# Patient Record
Sex: Male | Born: 1950 | Race: White | Hispanic: No | Marital: Married | Smoking: Current some day smoker
Health system: Southern US, Community
[De-identification: ages and names within clinical notes are randomized; demographics above are authoritative.]

## PROBLEM LIST (undated history)

## (undated) DIAGNOSIS — J449 Chronic obstructive pulmonary disease, unspecified: Secondary | ICD-10-CM

## (undated) DIAGNOSIS — G8929 Other chronic pain: Secondary | ICD-10-CM

## (undated) DIAGNOSIS — I10 Essential (primary) hypertension: Secondary | ICD-10-CM

## (undated) DIAGNOSIS — IMO0002 Reserved for concepts with insufficient information to code with codable children: Secondary | ICD-10-CM

## (undated) DIAGNOSIS — B1920 Unspecified viral hepatitis C without hepatic coma: Secondary | ICD-10-CM

## (undated) DIAGNOSIS — K7460 Unspecified cirrhosis of liver: Secondary | ICD-10-CM

## (undated) DIAGNOSIS — F4310 Post-traumatic stress disorder, unspecified: Secondary | ICD-10-CM

## (undated) DIAGNOSIS — D696 Thrombocytopenia, unspecified: Secondary | ICD-10-CM

## (undated) DIAGNOSIS — M25562 Pain in left knee: Secondary | ICD-10-CM

## (undated) HISTORY — PX: JOINT REPLACEMENT: SHX530

## (undated) HISTORY — PX: KNEE SURGERY: SHX244

## (undated) HISTORY — PX: TOTAL HIP ARTHROPLASTY: SHX124

## (undated) HISTORY — PX: ANKLE SURGERY: SHX546

## (undated) HISTORY — PX: TONSILLECTOMY: SUR1361

---

## 2006-07-30 ENCOUNTER — Ambulatory Visit: Payer: Self-pay | Admitting: Orthopedic Surgery

## 2008-03-10 ENCOUNTER — Emergency Department (HOSPITAL_COMMUNITY): Admission: EM | Admit: 2008-03-10 | Discharge: 2008-03-10 | Payer: Self-pay | Admitting: Emergency Medicine

## 2009-12-18 ENCOUNTER — Emergency Department (HOSPITAL_COMMUNITY): Admission: EM | Admit: 2009-12-18 | Discharge: 2009-12-18 | Payer: Self-pay | Admitting: Emergency Medicine

## 2010-02-07 ENCOUNTER — Emergency Department (HOSPITAL_COMMUNITY): Admission: EM | Admit: 2010-02-07 | Discharge: 2010-02-07 | Payer: Self-pay | Admitting: Emergency Medicine

## 2010-10-05 ENCOUNTER — Emergency Department (HOSPITAL_COMMUNITY): Payer: Medicaid Other

## 2010-10-05 ENCOUNTER — Emergency Department (HOSPITAL_COMMUNITY)
Admission: EM | Admit: 2010-10-05 | Discharge: 2010-10-05 | Disposition: A | Discharge status: Home / Self Care | Payer: Medicaid Other | Attending: Emergency Medicine | Admitting: Emergency Medicine

## 2010-10-05 DIAGNOSIS — I10 Essential (primary) hypertension: Secondary | ICD-10-CM | POA: Insufficient documentation

## 2010-10-05 DIAGNOSIS — W19XXXA Unspecified fall, initial encounter: Secondary | ICD-10-CM | POA: Insufficient documentation

## 2010-10-05 DIAGNOSIS — IMO0002 Reserved for concepts with insufficient information to code with codable children: Secondary | ICD-10-CM | POA: Insufficient documentation

## 2010-10-05 DIAGNOSIS — Z96649 Presence of unspecified artificial hip joint: Secondary | ICD-10-CM | POA: Insufficient documentation

## 2010-11-02 ENCOUNTER — Emergency Department (HOSPITAL_COMMUNITY)
Admission: EM | Admit: 2010-11-02 | Discharge: 2010-11-02 | Discharge status: Against Medical Advice | Payer: Medicaid Other | Attending: Emergency Medicine | Admitting: Emergency Medicine

## 2010-11-02 ENCOUNTER — Encounter: Payer: Self-pay | Admitting: *Deleted

## 2010-11-02 DIAGNOSIS — R296 Repeated falls: Secondary | ICD-10-CM | POA: Insufficient documentation

## 2010-11-02 DIAGNOSIS — S99919A Unspecified injury of unspecified ankle, initial encounter: Secondary | ICD-10-CM | POA: Insufficient documentation

## 2010-11-02 DIAGNOSIS — S8990XA Unspecified injury of unspecified lower leg, initial encounter: Secondary | ICD-10-CM | POA: Insufficient documentation

## 2010-11-02 HISTORY — DX: Essential (primary) hypertension: I10

## 2010-11-02 NOTE — ED Notes (Signed)
Pain to left knee after stepping in hole and falling. Pt states no knee cap on knee due to previous injury.

## 2010-11-02 NOTE — ED Notes (Signed)
Called pt back to room and found AMA document signed by pt at 1451.  No answer when called from waiting room.

## 2010-11-03 ENCOUNTER — Emergency Department (HOSPITAL_COMMUNITY)
Admission: EM | Admit: 2010-11-03 | Discharge: 2010-11-03 | Disposition: A | Discharge status: Home / Self Care | Payer: Medicaid Other | Attending: Emergency Medicine | Admitting: Emergency Medicine

## 2010-11-03 ENCOUNTER — Encounter (HOSPITAL_COMMUNITY): Payer: Self-pay | Admitting: *Deleted

## 2010-11-03 DIAGNOSIS — S8392XA Sprain of unspecified site of left knee, initial encounter: Secondary | ICD-10-CM

## 2010-11-03 DIAGNOSIS — I10 Essential (primary) hypertension: Secondary | ICD-10-CM | POA: Insufficient documentation

## 2010-11-03 DIAGNOSIS — Y9269 Other specified industrial and construction area as the place of occurrence of the external cause: Secondary | ICD-10-CM | POA: Insufficient documentation

## 2010-11-03 DIAGNOSIS — X500XXA Overexertion from strenuous movement or load, initial encounter: Secondary | ICD-10-CM | POA: Insufficient documentation

## 2010-11-03 DIAGNOSIS — IMO0002 Reserved for concepts with insufficient information to code with codable children: Secondary | ICD-10-CM | POA: Insufficient documentation

## 2010-11-03 HISTORY — DX: Reserved for concepts with insufficient information to code with codable children: IMO0002

## 2010-11-03 MED ORDER — HYDROCODONE-ACETAMINOPHEN 5-325 MG PO TABS: ORAL_TABLET | ORAL | Status: DC

## 2010-11-03 NOTE — ED Provider Notes (Signed)
History     Chief Complaint  Patient presents with  . Knee Pain   Patient is a 60 y.o. male presenting with knee pain. The history is provided by the patient. No language interpreter was used.  Knee Pain Episode onset: several days ago.  pt had  trauma to L knee ~ 30 years ago and had patella removed.  he injured recently getting out of a hole that a footing was being poured in.  has  appt for MRI and with his orthopedist at [facility] in [place], [place] on 10-13-10. The problem occurs constantly. The problem has been gradually worsening. The symptoms are aggravated by bending. He has tried NSAIDs for the symptoms. The treatment provided mild relief.    Past Medical History  Diagnosis Date  . Hypertension   . Herniated disc     Past Surgical History  Procedure Date  . Knee surgery   . Total hip arthroplasty     No family history on file.  History  Substance Use Topics  . Smoking status: Current Some Day Smoker  . Smokeless tobacco: Not on file  . Alcohol Use: No      Review of Systems  Musculoskeletal: Positive for gait problem.  All other systems reviewed and are negative.    Physical Exam  BP 137/78  Pulse 60  Temp(Src) 97.9 F (36.6 C) (Oral)  Resp 18  Ht 5\' 7"  (1.702 m)  Wt 170 lb (77.111 kg)  BMI 26.63 kg/m2  SpO2 98%  Physical Exam  Nursing note and vitals reviewed. Constitutional: He is oriented to person, place, and time. Vital signs are normal. He appears well-developed and well-nourished.  HENT:  Head: Normocephalic and atraumatic.  Right Ear: External ear normal.  Left Ear: External ear normal.  Nose: Nose normal.  Mouth/Throat: No oropharyngeal exudate.  Eyes: Conjunctivae and EOM are normal. Pupils are equal, round, and reactive to light. Right eye exhibits no discharge. Left eye exhibits no discharge. No scleral icterus.  Neck: Normal range of motion. Neck supple. No JVD present. No tracheal deviation present. No thyromegaly present.    Cardiovascular: Normal rate, regular rhythm, normal heart sounds, intact distal pulses and normal pulses.  Exam reveals no gallop and no friction rub.   No murmur heard. Pulmonary/Chest: Effort normal and breath sounds normal. No stridor. No respiratory distress. He has no wheezes. He has no rales. He exhibits no tenderness.  Abdominal: Soft. Normal appearance and bowel sounds are normal. He exhibits no distension and no mass. There is no tenderness. There is no rebound and no guarding.  Musculoskeletal: Normal range of motion. He exhibits no edema and no tenderness.       Left knee: He exhibits swelling. He exhibits normal range of motion, no effusion, no ecchymosis, no deformity, no laceration and no erythema.       Legs: Lymphadenopathy:    He has no cervical adenopathy.  Neurological: He is alert and oriented to person, place, and time. He has normal reflexes. No cranial nerve deficit. Coordination normal. GCS eye subscore is 4. GCS verbal subscore is 5. GCS motor subscore is 6.  Reflex Scores:      Tricep reflexes are 2+ on the right side and 2+ on the left side.      Bicep reflexes are 2+ on the right side and 2+ on the left side.      Brachioradialis reflexes are 2+ on the right side and 2+ on the left side.  Patellar reflexes are 2+ on the right side and 2+ on the left side.      Achilles reflexes are 2+ on the right side and 2+ on the left side. Skin: Skin is warm and dry. No rash noted. He is not diaphoretic.  Psychiatric: He has a normal mood and affect. His speech is normal and behavior is normal. Judgment and thought content normal. Cognition and memory are normal.    ED Course  Procedures  MDM       Worthy Rancher, PA 11/03/10 1334

## 2010-11-03 NOTE — ED Notes (Signed)
Pt a/ox4. Resp even and unlabored. D/C instructions reviewed with pt. Pt verbalized understanding. Pt escorted to d/c desk. Pt ambulated with steady gate. 

## 2010-11-03 NOTE — ED Notes (Signed)
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  C/o increased pain/swelling.

## 2010-12-07 ENCOUNTER — Encounter (HOSPITAL_COMMUNITY): Payer: Self-pay | Admitting: Emergency Medicine

## 2010-12-07 ENCOUNTER — Emergency Department (HOSPITAL_COMMUNITY)
Admission: EM | Admit: 2010-12-07 | Discharge: 2010-12-07 | Disposition: A | Discharge status: Home / Self Care | Payer: Medicaid Other | Attending: Emergency Medicine | Admitting: Emergency Medicine

## 2010-12-07 DIAGNOSIS — I10 Essential (primary) hypertension: Secondary | ICD-10-CM | POA: Insufficient documentation

## 2010-12-07 DIAGNOSIS — M545 Low back pain, unspecified: Secondary | ICD-10-CM | POA: Insufficient documentation

## 2010-12-07 DIAGNOSIS — Z809 Family history of malignant neoplasm, unspecified: Secondary | ICD-10-CM | POA: Insufficient documentation

## 2010-12-07 DIAGNOSIS — F17200 Nicotine dependence, unspecified, uncomplicated: Secondary | ICD-10-CM | POA: Insufficient documentation

## 2010-12-07 HISTORY — DX: Unspecified viral hepatitis C without hepatic coma: B19.20

## 2010-12-07 MED ORDER — OXYCODONE-ACETAMINOPHEN 5-325 MG PO TABS: 1.0000 | ORAL_TABLET | ORAL | Status: AC | PRN

## 2010-12-07 NOTE — ED Provider Notes (Signed)
Medical screening examination/treatment/procedure(s) were performed by non-physician practitioner and as supervising physician I was immediately available for consultation/collaboration.   Channon Brougher B. Bernette Mayers, MD 12/07/10 1318

## 2010-12-07 NOTE — ED Provider Notes (Signed)
History     CSN: [id number] Arrival date & time: 12/07/2010 12:11 PM  Chief Complaint  Patient presents with  . Back Pain   Patient is a 60 y.o. male presenting with back pain. The history is provided by the patient.  Back Pain  This is a recurrent problem. The current episode started 3 to 5 hours ago. The problem occurs constantly. The problem has not changed since onset.The pain is associated with lifting heavy objects and twisting. The pain is present in the lumbar spine. The quality of the pain is described as stabbing, aching and shooting. The pain radiates to the right thigh. The pain is moderate. The symptoms are aggravated by bending, twisting and certain positions. The pain is the same all the time. Associated symptoms include leg pain. Pertinent negatives include no chest pain, no fever, no numbness, no headaches, no abdominal pain, no abdominal swelling, no bowel incontinence, no perianal numbness, no dysuria, no pelvic pain, no tingling and no weakness. He has tried nothing for the symptoms.    Past Medical History  Diagnosis Date  . Hypertension   . Herniated disc   . Hepatitis C   . Urinary bladder cancer     Past Surgical History  Procedure Date  . Knee surgery   . Total hip arthroplasty   . Tonsillectomy     Family History  Problem Relation Age of Onset  . Cancer Mother     History  Substance Use Topics  . Smoking status: Current Some Day Smoker -- 0.2 packs/day for 30 years    Types: Cigarettes  . Smokeless tobacco: Former Neurosurgeon    Types: Chew  . Alcohol Use: No      Review of Systems  Constitutional: Negative for fever.  Cardiovascular: Negative for chest pain.  Gastrointestinal: Negative for abdominal pain and bowel incontinence.  Genitourinary: Negative for dysuria, frequency, flank pain, decreased urine volume, difficulty urinating and pelvic pain.  Musculoskeletal: Positive for back pain. Negative for myalgias, joint swelling and gait problem.    Neurological: Negative for dizziness, tingling, weakness, numbness and headaches.  Hematological: Does not bruise/bleed easily.  All other systems reviewed and are negative.    Physical Exam  BP 141/86  Pulse 97  Temp(Src) 98.2 F (36.8 C) (Oral)  Resp 19  Ht 5\' 7"  (1.702 m)  Wt 172 lb (78.019 kg)  BMI 26.94 kg/m2  SpO2 98%  Physical Exam  Nursing note and vitals reviewed. Constitutional: He is oriented to person, place, and time. He appears well-developed and well-nourished. No distress.  HENT:  Head: Normocephalic and atraumatic.  Neck: Normal range of motion. Neck supple. No thyromegaly present.  Cardiovascular: Normal rate, regular rhythm and normal heart sounds.   Pulmonary/Chest: Effort normal and breath sounds normal.  Musculoskeletal: He exhibits tenderness. He exhibits no edema.       Lumbar back: He exhibits tenderness. He exhibits normal range of motion, no bony tenderness, no swelling, no edema and normal pulse.       Back:  Lymphadenopathy:    He has no cervical adenopathy.  Neurological: He is alert and oriented to person, place, and time. He has normal strength and normal reflexes. No cranial nerve deficit or sensory deficit. Coordination and gait normal. GCS eye subscore is 4. GCS verbal subscore is 5. GCS motor subscore is 6.  Reflex Scores:      Patellar reflexes are 2+ on the right side and 2+ on the left side.  Achilles reflexes are 2+ on the right side and 2+ on the left side. Skin: Skin is warm and dry.    ED Course  Procedures  MDM   1300  Patient is ambulatory, No focal neuro deficits, ttp of the right lumbar paraspinal muscles and right SI joint space.  Hx of previous L3-4 disc problems.  HAs appt on Wednesday with his PMD   The patient appears reasonably screened and/or stabilized for discharge and I doubt any other medical condition or other [facility] requiring further screening, evaluation, or treatment in the ED at this time prior to  discharge.           Samay Delcarlo L. [place], [place] 12/07/10 1315

## 2010-12-07 NOTE — ED Notes (Signed)
Patient c/o lower back pain. Per patient hx of 2 ruptured vertebra that he is having surgery for on 11/5. Patient states "I hurt it this morning moving tobacco barn."

## 2010-12-30 ENCOUNTER — Emergency Department (HOSPITAL_COMMUNITY)
Admission: EM | Admit: 2010-12-30 | Discharge: 2010-12-30 | Disposition: A | Discharge status: Home / Self Care | Payer: Medicaid Other | Attending: Emergency Medicine | Admitting: Emergency Medicine

## 2010-12-30 ENCOUNTER — Encounter (HOSPITAL_COMMUNITY): Payer: Self-pay

## 2010-12-30 DIAGNOSIS — M545 Low back pain, unspecified: Secondary | ICD-10-CM | POA: Insufficient documentation

## 2010-12-30 DIAGNOSIS — M5430 Sciatica, unspecified side: Secondary | ICD-10-CM

## 2010-12-30 DIAGNOSIS — Z79899 Other long term (current) drug therapy: Secondary | ICD-10-CM | POA: Insufficient documentation

## 2010-12-30 DIAGNOSIS — I10 Essential (primary) hypertension: Secondary | ICD-10-CM | POA: Insufficient documentation

## 2010-12-30 MED ORDER — OXYCODONE-ACETAMINOPHEN 5-325 MG PO TABS: 2.0000 | ORAL_TABLET | ORAL | Status: AC | PRN

## 2010-12-30 NOTE — ED Provider Notes (Signed)
Scribed for Nicholes Stairs, MD, the patient was seen in room APA17/APA17 . This chart was scribed by Ellie Lunch. This patient's care was started at 9:36 AM.   CSN: [id number] Arrival date & time: 12/30/2010  9:05 AM   Chief Complaint  Patient presents with  . Back Pain     (Include location/radiation/quality/duration/timing/severity/associated sxs/prior treatment) HPI Victor Davidson is a 60 y.o. male who presents to the Emergency Department complaining of back pain that radiated down left leg to ankle starting 3 days ago. Denies weakness, urinary or bowel incontinence. Back pain started when he was moving hay bails on Friday. Pt reports he has 2 ruptured discs and a planned surgery scheduled 01/19/11.  Past Medical History  Diagnosis Date  . Hypertension   . Herniated disc   . Hepatitis C   . Urinary bladder cancer      Past Surgical History  Procedure Date  . Knee surgery   . Total hip arthroplasty   . Tonsillectomy     Family History  Problem Relation Age of Onset  . Cancer Mother     History  Substance Use Topics  . Smoking status: Current Some Day Smoker -- 0.2 packs/day for 30 years    Types: Cigarettes  . Smokeless tobacco: Former Neurosurgeon    Types: Chew  . Alcohol Use: No    Review of Systems 10 Systems reviewed and are negative for acute change except as noted in the HPI.  Allergies  Review of patient's allergies indicates no known allergies.  Home Medications   Current Outpatient Rx  Name Route Sig Dispense Refill  . ALBUTEROL 90 MCG/ACT IN AERS Inhalation Inhale 2 puffs into the lungs every 6 (six) hours as needed. Mild COPD     . AMITRIPTYLINE HCL 10 MG PO TABS Oral Take 15 mg by mouth at bedtime.      Marland Kitchen AMLODIPINE BESYLATE 10 MG PO TABS Oral Take 5 mg by mouth daily.      Marland Kitchen DIAZEPAM PO Oral Take by mouth.      Marland Kitchen GABAPENTIN 300 MG PO CAPS Oral Take 600-1,200 mg by mouth 2 (two) times daily as needed. Patient takes 2 capsules in the morning and  occasionally takes 4 capsules in the evening.     Marland Kitchen GABAPENTIN 600 MG PO TABS Oral Take 600 mg by mouth 3 (three) times daily.     Marland Kitchen HYDROCODONE-ACETAMINOPHEN 5-325 MG PO TABS  One tablet po q 4-6 hrs prn pain 20 tablet 0    Dispense as written.  Marland Kitchen NICOTINE 7 MG/24HR TD PT24 Transdermal Place 1 patch onto the skin daily.      Marland Kitchen PROPRANOLOL HCL 40 MG PO TABS Oral Take 40 mg by mouth daily.        Physical Exam    BP 145/72  Pulse 71  Temp(Src) 98.3 F (36.8 C) (Oral)  Resp 16  Ht 5\' 7"  (1.702 m)  Wt 172 lb (78.019 kg)  BMI 26.94 kg/m2  SpO2 100%  Physical Exam  Nursing note and vitals reviewed. Constitutional: He is oriented to person, place, and time. He appears well-developed and well-nourished.  HENT:  Head: Normocephalic and atraumatic.  Eyes: Conjunctivae and EOM are normal.  Neck: Normal range of motion. Neck supple.  Cardiovascular: Normal rate and regular rhythm.   Pulmonary/Chest: Effort normal and breath sounds normal.  Abdominal: Soft. There is no tenderness.  Musculoskeletal: Normal range of motion. He exhibits tenderness.  Tenderness to bilateral paraspinal muscular region.   Neurological: He is alert and oriented to person, place, and time. He has normal strength.  Skin: Skin is warm and dry.  Psychiatric: He has a normal mood and affect.   Procedures  OTHER DATA REVIEWED: Nursing notes, vital signs, and past medical records reviewed.  DIAGNOSTIC STUDIES: Oxygen Saturation is 100% on room air, normal by my interpretation.    ED COURSE / COORDINATION OF CARE: 9:56 EDP discussed pain management option with PT.   MDM: Sciatica No acute no neurological deficit.  No bowel or bladder incontinence  SCRIBE ATTESTATION: I personally performed the services described in this documentation, which was scribed in my presence. The recorded information has been reviewed and considered. Nicholes Stairs, MD       Nicholes Stairs, MD 12/30/10 1000

## 2010-12-30 NOTE — ED Notes (Signed)
Pt has 2 ruptured disc with surgery scheduled October 7th. Pt states he was " moving hay bails on Friday and I feel like I over did it" denies any difficulty with GI or GU. Pain radiates down left leg.

## 2011-01-29 ENCOUNTER — Emergency Department (HOSPITAL_COMMUNITY): Payer: Medicaid Other

## 2011-01-29 ENCOUNTER — Emergency Department (HOSPITAL_COMMUNITY)
Admission: EM | Admit: 2011-01-29 | Discharge: 2011-01-29 | Disposition: A | Discharge status: Home / Self Care | Payer: Medicaid Other | Attending: Emergency Medicine | Admitting: Emergency Medicine

## 2011-01-29 ENCOUNTER — Encounter (HOSPITAL_COMMUNITY): Payer: Self-pay

## 2011-01-29 DIAGNOSIS — M1990 Unspecified osteoarthritis, unspecified site: Secondary | ICD-10-CM

## 2011-01-29 DIAGNOSIS — F17200 Nicotine dependence, unspecified, uncomplicated: Secondary | ICD-10-CM | POA: Insufficient documentation

## 2011-01-29 DIAGNOSIS — Z8551 Personal history of malignant neoplasm of bladder: Secondary | ICD-10-CM | POA: Insufficient documentation

## 2011-01-29 DIAGNOSIS — I10 Essential (primary) hypertension: Secondary | ICD-10-CM | POA: Insufficient documentation

## 2011-01-29 DIAGNOSIS — M25569 Pain in unspecified knee: Secondary | ICD-10-CM | POA: Insufficient documentation

## 2011-01-29 DIAGNOSIS — M1710 Unilateral primary osteoarthritis, unspecified knee: Secondary | ICD-10-CM | POA: Insufficient documentation

## 2011-01-29 DIAGNOSIS — X500XXA Overexertion from strenuous movement or load, initial encounter: Secondary | ICD-10-CM | POA: Insufficient documentation

## 2011-01-29 DIAGNOSIS — Z96649 Presence of unspecified artificial hip joint: Secondary | ICD-10-CM | POA: Insufficient documentation

## 2011-01-29 DIAGNOSIS — Z9889 Other specified postprocedural states: Secondary | ICD-10-CM | POA: Insufficient documentation

## 2011-01-29 DIAGNOSIS — B1920 Unspecified viral hepatitis C without hepatic coma: Secondary | ICD-10-CM | POA: Insufficient documentation

## 2011-01-29 MED ORDER — MELOXICAM 7.5 MG PO TABS: 7.5000 mg | ORAL_TABLET | Freq: Every day | ORAL | Status: DC

## 2011-01-29 MED ORDER — HYDROCODONE-ACETAMINOPHEN 7.5-325 MG PO TABS: ORAL_TABLET | ORAL | Status: DC

## 2011-01-29 NOTE — ED Notes (Signed)
Pt reports has chronic issues with left knee.  Says is supposed to have a total knee replacement Nov 6th.  Reports Sunday was working on a tractor and twisted left knee.

## 2011-01-29 NOTE — ED Provider Notes (Signed)
Medical screening examination/treatment/procedure(s) were performed by non-physician practitioner and as supervising physician I was immediately available for consultation/collaboration.  Nicholes Stairs, MD 01/29/11 1555

## 2011-01-29 NOTE — ED Notes (Signed)
H. Bryant, PA-C to bs for eval. 

## 2011-01-29 NOTE — ED Provider Notes (Signed)
History     CSN: [id number] Arrival date & time: 01/29/2011 12:01 PM   First MD Initiated Contact with Patient 01/29/11 1341      Chief Complaint  Patient presents with  . Knee Pain    (Consider location/radiation/quality/duration/timing/severity/associated sxs/prior treatment) Patient is a 60 y.o. male presenting with knee pain. The history is provided by the patient.  Knee Pain This is a recurrent problem. The current episode started in the past 7 days. The problem occurs constantly. The problem has been unchanged. Associated symptoms include arthralgias. Pertinent negatives include no abdominal pain, chest pain, coughing or neck pain. The symptoms are aggravated by standing and walking. Treatments tried: His own medications. The treatment provided no relief.    Past Medical History  Diagnosis Date  . Hypertension   . Herniated disc   . Hepatitis C   . Urinary bladder cancer     Past Surgical History  Procedure Date  . Knee surgery   . Total hip arthroplasty   . Tonsillectomy     Family History  Problem Relation Age of Onset  . Cancer Mother     History  Substance Use Topics  . Smoking status: Current Some Day Smoker -- 0.2 packs/day for 30 years    Types: Cigarettes  . Smokeless tobacco: Former Neurosurgeon    Types: Chew  . Alcohol Use: No      Review of Systems  Constitutional: Negative for activity change.       All ROS Neg except as noted in HPI  HENT: Negative for nosebleeds and neck pain.   Eyes: Negative for photophobia and discharge.  Respiratory: Negative for cough, shortness of breath and wheezing.   Cardiovascular: Negative for chest pain and palpitations.  Gastrointestinal: Negative for abdominal pain and blood in stool.  Genitourinary: Negative for dysuria, frequency and hematuria.  Musculoskeletal: Positive for arthralgias. Negative for back pain.  Skin: Negative.   Neurological: Negative for dizziness, seizures and speech difficulty.    Psychiatric/Behavioral: Negative for hallucinations and confusion.    Allergies  Review of patient's allergies indicates no known allergies.  Home Medications   Current Outpatient Rx  Name Route Sig Dispense Refill  . AMLODIPINE BESYLATE 10 MG PO TABS Oral Take 10 mg by mouth daily as needed. For higher stress days     . CYCLOBENZAPRINE HCL 10 MG PO TABS Oral Take 10 mg by mouth 3 (three) times daily as needed. For back and knee pain     . FLUTICASONE-SALMETEROL 250-50 MCG/DOSE IN AEPB Inhalation Inhale 1 puff into the lungs every 12 (twelve) hours as needed. For shortness of breath or over exposure to allergens     . GABAPENTIN 300 MG PO CAPS Oral Take 600 mg by mouth 3 (three) times daily.     Marland Kitchen HYDROCODONE-ACETAMINOPHEN 7.5-325 MG PO TABS  1 po q4h prn pain 20 tablet 0  . MELOXICAM 7.5 MG PO TABS Oral Take 1 tablet (7.5 mg total) by mouth daily. 10 tablet 0    BP 138/88  Pulse 62  Temp(Src) 98.4 F (36.9 C) (Oral)  Resp 20  Ht 5\' 7"  (1.702 m)  Wt 168 lb (76.204 kg)  BMI 26.31 kg/m2  SpO2 99%  Physical Exam  Nursing note and vitals reviewed. Constitutional: He is oriented to person, place, and time. He appears well-developed and well-nourished.  Non-toxic appearance.  HENT:  Head: Normocephalic.  Right Ear: Tympanic membrane and external ear normal.  Left Ear: Tympanic membrane and external ear normal.  Eyes: EOM and lids are normal. Pupils are equal, round, and reactive to light.  Neck: Normal range of motion. Neck supple. Carotid bruit is not present.  Cardiovascular: Normal rate, regular rhythm, normal heart sounds, intact distal pulses and normal pulses.   Pulmonary/Chest: Breath sounds normal. No respiratory distress.  Abdominal: Soft. Bowel sounds are normal. There is no tenderness. There is no guarding.  Musculoskeletal: He exhibits tenderness.       Well healed surgical scar of the mid left knee. Pain with attempted ROM of the left knee. Crepitus noted. No  effusion. No deformity. No posterior mass. Distal pulses wnl.  Lymphadenopathy:       Head (right side): No submandibular adenopathy present.       Head (left side): No submandibular adenopathy present.    He has no cervical adenopathy.  Neurological: He is alert and oriented to person, place, and time. He has normal strength. No cranial nerve deficit or sensory deficit.  Skin: Skin is warm and dry.  Psychiatric: He has a normal mood and affect. His speech is normal.    ED Course  Procedures (including critical care time)  Labs Reviewed - No data to display Dg Knee Complete 4 Views Left  01/29/2011  *RADIOLOGY REPORT*  Clinical Data: Knee pain and swelling.  LEFT KNEE - COMPLETE 4+ VIEW  Comparison: 10/05/2010  Findings: No evidence for an acute fracture.  No subluxation or dislocation.  There is loss of joint space in medial compartment. Degenerative spurring is seen in the medial and lateral joint spaces.  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  IMPRESSION: No acute bony findings.  Original Report Authenticated By: ERIC A. MANSELL, M.D.     1. Knee pain   2. DJD (degenerative joint disease)       MDM  I have reviewed nursing notes, vital signs, and all appropriate lab and imaging results for this patient.        Kathie Dike, [place] 01/29/11 [phone]

## 2011-02-10 ENCOUNTER — Encounter (HOSPITAL_COMMUNITY): Payer: Self-pay | Admitting: *Deleted

## 2011-02-10 ENCOUNTER — Emergency Department (HOSPITAL_COMMUNITY)
Admission: EM | Admit: 2011-02-10 | Discharge: 2011-02-10 | Disposition: A | Discharge status: Home / Self Care | Payer: Non-veteran care | Attending: Emergency Medicine | Admitting: Emergency Medicine

## 2011-02-10 DIAGNOSIS — F17200 Nicotine dependence, unspecified, uncomplicated: Secondary | ICD-10-CM | POA: Insufficient documentation

## 2011-02-10 DIAGNOSIS — S8010XA Contusion of unspecified lower leg, initial encounter: Secondary | ICD-10-CM | POA: Insufficient documentation

## 2011-02-10 DIAGNOSIS — W108XXA Fall (on) (from) other stairs and steps, initial encounter: Secondary | ICD-10-CM | POA: Insufficient documentation

## 2011-02-10 DIAGNOSIS — IMO0002 Reserved for concepts with insufficient information to code with codable children: Secondary | ICD-10-CM | POA: Insufficient documentation

## 2011-02-10 DIAGNOSIS — I10 Essential (primary) hypertension: Secondary | ICD-10-CM | POA: Insufficient documentation

## 2011-02-10 DIAGNOSIS — T07XXXA Unspecified multiple injuries, initial encounter: Secondary | ICD-10-CM

## 2011-02-10 DIAGNOSIS — B1920 Unspecified viral hepatitis C without hepatic coma: Secondary | ICD-10-CM | POA: Insufficient documentation

## 2011-02-10 DIAGNOSIS — Z8551 Personal history of malignant neoplasm of bladder: Secondary | ICD-10-CM | POA: Insufficient documentation

## 2011-02-10 MED ORDER — BACITRACIN-NEOMYCIN-POLYMYXIN 400-5-5000 EX OINT
TOPICAL_OINTMENT | Freq: Once | CUTANEOUS | Status: AC
  Administered 2011-02-10: 1 via TOPICAL
  Filled 2011-02-10: qty 1

## 2011-02-10 MED ORDER — OXYCODONE-ACETAMINOPHEN 5-325 MG PO TABS: 1.0000 | ORAL_TABLET | ORAL | Status: AC | PRN

## 2011-02-10 MED ORDER — OXYCODONE-ACETAMINOPHEN 5-325 MG PO TABS
1.0000 | ORAL_TABLET | Freq: Once | ORAL | Status: AC
  Administered 2011-02-10: 1 via ORAL
  Filled 2011-02-10: qty 1

## 2011-02-10 MED ORDER — DOXYCYCLINE HYCLATE 100 MG PO CAPS: 100.0000 mg | ORAL_CAPSULE | Freq: Two times a day (BID) | ORAL | Status: AC

## 2011-02-10 MED ORDER — DOXYCYCLINE HYCLATE 100 MG PO TABS
100.0000 mg | ORAL_TABLET | Freq: Once | ORAL | Status: AC
  Administered 2011-02-10: 100 mg via ORAL
  Filled 2011-02-10: qty 1

## 2011-02-10 NOTE — ED Notes (Signed)
Pt states that he fell down the steps on Saturday night after his knee gave way. Laceration to his right lower extremity. Swollen and red this am.

## 2011-02-10 NOTE — ED Provider Notes (Signed)
History     CSN: [id number] Arrival date & time: 02/10/2011  9:13 AM   First MD Initiated Contact with Patient 02/10/11 [phone]      Chief Complaint  Patient presents with  . Extremity Laceration    (Consider location/radiation/quality/duration/timing/severity/associated sxs/prior treatment) HPI Comments: Patient c/o abrasions to the right lower leg that occurred 2 days ago when he tripped, fell down 2 steps.  States he scraped his leg on the edge of the step.  Also c/o bruise to the left medial calf.  States he cleaned the wounds with peroxide and kept it bandaged.  Today, woke up with redness and mild swelling surrounding the abrasions.  He denies numbness, weakness, fever, chills, or red streaks.  Also denies LOC, neck pain , back pain or other injuries  Patient is a 60 y.o. male presenting with leg pain. The history is provided by the patient.  Leg Pain  The incident occurred 2 days ago. The incident occurred at home. The injury mechanism was a fall. The pain is present in the right leg. The quality of the pain is described as aching. The pain is moderate. The pain has been constant since onset. Pertinent negatives include no numbness, no inability to bear weight, no loss of motion, no muscle weakness, no loss of sensation and no tingling. Associated symptoms comments: Abrasions, redness. He reports no foreign bodies present. The symptoms are aggravated by palpation and activity. He has tried elevation for the symptoms. The treatment provided no relief.    Past Medical History  Diagnosis Date  . Hypertension   . Herniated disc   . Hepatitis C   . Urinary bladder cancer     Past Surgical History  Procedure Date  . Knee surgery   . Total hip arthroplasty   . Tonsillectomy     Family History  Problem Relation Age of Onset  . Cancer Mother     History  Substance Use Topics  . Smoking status: Current Some Day Smoker -- 0.2 packs/day for 30 years    Types: Cigarettes  .  Smokeless tobacco: Former Neurosurgeon    Types: Chew  . Alcohol Use: No      Review of Systems  Constitutional: Negative for fever, chills and fatigue.  HENT: Negative for sore throat, trouble swallowing, neck pain and neck stiffness.   Respiratory: Negative for cough, shortness of breath and wheezing.   Cardiovascular: Negative for chest pain.  Gastrointestinal: Negative for nausea, vomiting and abdominal pain.  Genitourinary: Negative for flank pain.  Musculoskeletal: Positive for myalgias. Negative for back pain.  Skin: Positive for wound. Negative for rash.  Neurological: Negative for dizziness, tingling, weakness and numbness.  Hematological: Negative for adenopathy. Does not bruise/bleed easily.  All other systems reviewed and are negative.    Allergies  Toradol  Home Medications   Current Outpatient Rx  Name Route Sig Dispense Refill  . AMLODIPINE BESYLATE 10 MG PO TABS Oral Take 10 mg by mouth daily as needed. For higher stress days     . CYCLOBENZAPRINE HCL 10 MG PO TABS Oral Take 10 mg by mouth 3 (three) times daily as needed. For back and knee pain     . FLUTICASONE-SALMETEROL 250-50 MCG/DOSE IN AEPB Inhalation Inhale 1 puff into the lungs every 12 (twelve) hours as needed. For shortness of breath or over exposure to allergens     . GABAPENTIN 300 MG PO CAPS Oral Take 600 mg by mouth 3 (three) times daily.     Marland Kitchen  HYDROCODONE-ACETAMINOPHEN 7.5-325 MG PO TABS  1 po q4h prn pain 20 tablet 0  . MELOXICAM 7.5 MG PO TABS Oral Take 1 tablet (7.5 mg total) by mouth daily. 10 tablet 0    BP 128/77  Pulse 70  Temp(Src) 98.4 F (36.9 C) (Oral)  Resp 16  Ht 5\' 6"  (1.676 m)  Wt 162 lb (73.483 kg)  BMI 26.15 kg/m2  SpO2 98%  Physical Exam  Nursing note and vitals reviewed. Constitutional: He is oriented to person, place, and time. He appears well-developed and well-nourished. No distress.  HENT:  Head: Normocephalic and atraumatic.  Mouth/Throat: Oropharynx is clear and  moist.  Neck: Normal range of motion. Neck supple.  Cardiovascular: Normal rate, regular rhythm and normal heart sounds.   Pulmonary/Chest: Effort normal and breath sounds normal. No respiratory distress. He exhibits no tenderness.  Abdominal: Soft. He exhibits no distension. There is no tenderness.  Musculoskeletal: Normal range of motion. He exhibits tenderness. He exhibits no edema.       See skin exam  Lymphadenopathy:    He has no cervical adenopathy.  Neurological: He is alert and oriented to person, place, and time. No cranial nerve deficit. He exhibits normal muscle tone. Coordination normal.  Skin: Skin is warm and dry.          Superficial abrasions to the anterior right lower leg.  No drainage, lymphangitis, or significant edema.  Posterior leg is soft    ED Course  Procedures (including critical care time)      MDM     10:28 AM Linear abrasions to the right lower leg with mild surrounding erythema.  No significant edema of the leg.  Distal pulses are intact.  Sensation also intact.  Pt ambulates well.  Large bruise to the posterior-medial right calf w/o edema.  Abrasions were cleaned and bandaged by the nursing staff.  I have given wound care instructions and will start doxy.  Patient has appt with his PMD at the [place] in 3 days.  Also advised to return here if sx's worsen.   Patient / Family / Caregiver understand and agree with initial ED impression and plan with expectations set for ED visit.   Pt feels improved after observation and/or treatment in ED.      Gery Sabedra L. Takari Duncombe, [place] 02/10/11 1037

## 2011-02-10 NOTE — ED Provider Notes (Signed)
Medical screening examination/treatment/procedure(s) were performed by non-physician practitioner and as supervising physician I was immediately available for consultation/collaboration.  Ethelda Chick, MD 02/10/11 1041

## 2011-03-10 ENCOUNTER — Emergency Department (HOSPITAL_COMMUNITY)
Admission: EM | Admit: 2011-03-10 | Discharge: 2011-03-10 | Disposition: A | Discharge status: Home / Self Care | Payer: Non-veteran care | Attending: Emergency Medicine | Admitting: Emergency Medicine

## 2011-03-10 ENCOUNTER — Emergency Department (HOSPITAL_COMMUNITY): Payer: Non-veteran care

## 2011-03-10 ENCOUNTER — Encounter (HOSPITAL_COMMUNITY): Payer: Self-pay | Admitting: *Deleted

## 2011-03-10 DIAGNOSIS — S8392XA Sprain of unspecified site of left knee, initial encounter: Secondary | ICD-10-CM

## 2011-03-10 DIAGNOSIS — Y92009 Unspecified place in unspecified non-institutional (private) residence as the place of occurrence of the external cause: Secondary | ICD-10-CM | POA: Insufficient documentation

## 2011-03-10 DIAGNOSIS — IMO0002 Reserved for concepts with insufficient information to code with codable children: Secondary | ICD-10-CM | POA: Insufficient documentation

## 2011-03-10 DIAGNOSIS — X500XXA Overexertion from strenuous movement or load, initial encounter: Secondary | ICD-10-CM | POA: Insufficient documentation

## 2011-03-10 DIAGNOSIS — M25569 Pain in unspecified knee: Secondary | ICD-10-CM | POA: Insufficient documentation

## 2011-03-10 MED ORDER — OXYCODONE-ACETAMINOPHEN 5-325 MG PO TABS: ORAL_TABLET | ORAL | Status: DC

## 2011-03-10 MED ORDER — HYDROCODONE-ACETAMINOPHEN 5-325 MG PO TABS: ORAL_TABLET | ORAL | Status: DC

## 2011-03-10 NOTE — ED Notes (Signed)
Pt states he twisted his left knee this morning attempting to step up onto a tractor.  Pt states he slipped through the steps.  Pt denies hitting knee.  Pt states swelling to left knee is normal for him.  NAD at this time.  Stretcher in low, locked position.  Pt resting on stretcher.  PA at bedside.

## 2011-03-10 NOTE — ED Notes (Signed)
Pt alert and oriented with respirations even and unlabored.  Discharge instructions and Rx x 1 reviewed with pt and pt verbalized understanding.  Pt verbalized that he will follow up with the [facility] hospital.  NAD at this time.

## 2011-03-10 NOTE — ED Notes (Signed)
Pt c/o pain in his left knee. States that he was stepping on his tractor and it had ice on the step. Slid off and twisted his knee.

## 2011-03-10 NOTE — ED Provider Notes (Signed)
History     CSN: [id number] Arrival date & time: 03/10/2011  8:23 AM   First MD Initiated Contact with Patient 03/10/11 0827      Chief Complaint  Patient presents with  . Knee Injury    (Consider location/radiation/quality/duration/timing/severity/associated sxs/prior treatment) HPI Comments: Pt was climbing up into his tractor and his L foot slipped and he twisted it.  He has prostetics in L knee.  Pain worse with movement , standing and walking.  The history is provided by the patient. No language interpreter was used.    Past Medical History  Diagnosis Date  . Hypertension   . Herniated disc   . Hepatitis C   . Urinary bladder cancer   . Hepatitis C     Past Surgical History  Procedure Date  . Knee surgery   . Total hip arthroplasty   . Tonsillectomy     Family History  Problem Relation Age of Onset  . Cancer Mother     History  Substance Use Topics  . Smoking status: Former Smoker -- 0.2 packs/day for 30 years    Types: Cigarettes    Quit date: 01/08/2011  . Smokeless tobacco: Former Neurosurgeon    Types: Chew  . Alcohol Use: No      Review of Systems  Musculoskeletal:       Knee injury   All other systems reviewed and are negative.    Allergies  Toradol  Home Medications   Current Outpatient Rx  Name Route Sig Dispense Refill  . AMLODIPINE BESYLATE 10 MG PO TABS Oral Take 10 mg by mouth daily as needed. For higher stress days     . CYCLOBENZAPRINE HCL 10 MG PO TABS Oral Take 10 mg by mouth 3 (three) times daily as needed. For back and knee pain     . FLUTICASONE-SALMETEROL 250-50 MCG/DOSE IN AEPB Inhalation Inhale 1 puff into the lungs every 12 (twelve) hours as needed. For shortness of breath or over exposure to allergens     . GABAPENTIN 300 MG PO CAPS Oral Take 600 mg by mouth 3 (three) times daily.     Marland Kitchen HYDROCODONE-ACETAMINOPHEN 7.5-325 MG PO TABS  1 po q4h prn pain 20 tablet 0  . MELOXICAM 7.5 MG PO TABS Oral Take 1 tablet (7.5 mg total)  by mouth daily. 10 tablet 0    BP 139/88  Pulse 85  Temp(Src) 97.6 F (36.4 C) (Oral)  Resp 18  Ht 5\' 7"  (1.702 m)  Wt 162 lb (73.483 kg)  BMI 25.37 kg/m2  SpO2 99%  Physical Exam  Nursing note and vitals reviewed. Constitutional: He is oriented to person, place, and time. He appears well-developed and well-nourished. No distress.  HENT:  Head: Normocephalic and atraumatic.  Eyes: EOM are normal.  Neck: Normal range of motion.  Cardiovascular: Normal rate, regular rhythm, normal heart sounds and intact distal pulses.   Pulmonary/Chest: Effort normal and breath sounds normal. No respiratory distress.  Abdominal: Soft. He exhibits no distension. There is no tenderness.  Musculoskeletal: He exhibits tenderness.       Left knee: He exhibits decreased range of motion, swelling, effusion and bony tenderness. He exhibits no ecchymosis, no deformity, no laceration, no erythema, no LCL laxity, normal patellar mobility, normal meniscus and no MCL laxity. tenderness found. Medial joint line, lateral joint line and MCL tenderness noted. No LCL and no patellar tendon tenderness noted.  Neurological: He is alert and oriented to person, place, and time.  Skin: Skin  is warm and dry. He is not diaphoretic.  Psychiatric: He has a normal mood and affect. Judgment normal.    ED Course  Procedures (including critical care time)  Labs Reviewed - No data to display No results found.   No diagnosis found.    MDM          Worthy Rancher, PA 03/10/11 [phone]

## 2011-03-10 NOTE — ED Provider Notes (Signed)
Medical screening examination/treatment/procedure(s) were performed by non-physician practitioner and as supervising physician I was immediately available for consultation/collaboration.   Gerhard Munch, MD 03/10/11 1002

## 2011-05-09 ENCOUNTER — Emergency Department (HOSPITAL_COMMUNITY)
Admission: EM | Admit: 2011-05-09 | Discharge: 2011-05-09 | Disposition: A | Discharge status: Home / Self Care | Payer: Non-veteran care | Attending: Emergency Medicine | Admitting: Emergency Medicine

## 2011-05-09 ENCOUNTER — Emergency Department (HOSPITAL_COMMUNITY): Payer: Non-veteran care

## 2011-05-09 ENCOUNTER — Encounter (HOSPITAL_COMMUNITY): Payer: Self-pay | Admitting: *Deleted

## 2011-05-09 DIAGNOSIS — Y9329 Activity, other involving ice and snow: Secondary | ICD-10-CM | POA: Insufficient documentation

## 2011-05-09 DIAGNOSIS — M25569 Pain in unspecified knee: Secondary | ICD-10-CM | POA: Insufficient documentation

## 2011-05-09 DIAGNOSIS — Z8619 Personal history of other infectious and parasitic diseases: Secondary | ICD-10-CM | POA: Insufficient documentation

## 2011-05-09 DIAGNOSIS — W010XXA Fall on same level from slipping, tripping and stumbling without subsequent striking against object, initial encounter: Secondary | ICD-10-CM | POA: Insufficient documentation

## 2011-05-09 DIAGNOSIS — M25562 Pain in left knee: Secondary | ICD-10-CM

## 2011-05-09 DIAGNOSIS — I10 Essential (primary) hypertension: Secondary | ICD-10-CM | POA: Insufficient documentation

## 2011-05-09 DIAGNOSIS — Z79899 Other long term (current) drug therapy: Secondary | ICD-10-CM | POA: Insufficient documentation

## 2011-05-09 MED ORDER — OXYCODONE-ACETAMINOPHEN 5-325 MG PO TABS
2.0000 | ORAL_TABLET | Freq: Once | ORAL | Status: AC
  Administered 2011-05-09: 2 via ORAL
  Filled 2011-05-09: qty 2

## 2011-05-09 MED ORDER — OXYCODONE-ACETAMINOPHEN 5-325 MG PO TABS: 1.0000 | ORAL_TABLET | ORAL | Status: AC | PRN

## 2011-05-09 NOTE — ED Notes (Signed)
Scheduled to have surgery for left knee replacement next month - reports injured left knee x 3 days ago, c/o increased pain.

## 2011-05-09 NOTE — ED Provider Notes (Signed)
History   This chart was scribed for Lyanne Co, MD by Clarita Crane. The patient was seen in room APA12/APA12 and the patient's care was started at 8:08AM.   CSN: [id number]  Arrival date & time 05/09/11  6962   First MD Initiated Contact with Patient 05/09/11 [phone]      Chief Complaint  Patient presents with  . Knee Pain    (Consider location/radiation/quality/duration/timing/severity/associated sxs/prior treatment) HPI Victor Davidson is a 61 y.o. male who presents to the Emergency Department complaining of constant moderate left knee pain localized to medial aspect onset 3 days ago after sustaining a fall after slipping on ice and twisting left knee and worsening since. Patient notes pain not relieved with use of Ibuprofen.  Denies fever, numbness, tingling. Reports he is scheduled to have knee replacement surgery performed at [place], [place] in 1 month. Patient with h/o hepatitis C, HTN, urinary bladder cancer and previous left knee surgery.  PCP- [place], [place] Past Medical History  Diagnosis Date  . Hypertension   . Herniated disc   . Hepatitis C   . Urinary bladder cancer   . Hepatitis C     Past Surgical History  Procedure Date  . Knee surgery   . Total hip arthroplasty   . Tonsillectomy     Family History  Problem Relation Age of Onset  . Cancer Mother     History  Substance Use Topics  . Smoking status: Former Smoker -- 0.2 packs/day for 30 years    Types: Cigarettes    Quit date: 01/08/2011  . Smokeless tobacco: Former Neurosurgeon    Types: Chew  . Alcohol Use: No      Review of Systems 10 Systems reviewed and are negative for acute change except as noted in the HPI.  Allergies  Toradol  Home Medications   Current Outpatient Rx  Name Route Sig Dispense Refill  . AMLODIPINE BESYLATE 10 MG PO TABS Oral Take 10 mg by mouth daily as needed. For higher stress days     . CYCLOBENZAPRINE HCL 10 MG PO TABS Oral Take 10 mg by mouth 3 (three) times  daily as needed. For back and knee pain     . FLUTICASONE-SALMETEROL 250-50 MCG/DOSE IN AEPB Inhalation Inhale 1 puff into the lungs every 12 (twelve) hours as needed. For shortness of breath or over exposure to allergens     . GABAPENTIN 300 MG PO CAPS Oral Take 600 mg by mouth 3 (three) times daily.     Marland Kitchen HYDROCODONE-ACETAMINOPHEN 7.5-325 MG PO TABS  1 po q4h prn pain 20 tablet 0  . MELOXICAM 7.5 MG PO TABS Oral Take 1 tablet (7.5 mg total) by mouth daily. 10 tablet 0  . OXYCODONE-ACETAMINOPHEN 5-325 MG PO TABS  One po q 4-6 hrs prn pain 15 tablet 0    BP 146/86  Pulse 81  Temp(Src) 98.2 F (36.8 C) (Oral)  Resp 20  Ht 5\' 7"  (1.702 m)  Wt 165 lb (74.844 kg)  BMI 25.84 kg/m2  SpO2 97%  Physical Exam  Nursing note and vitals reviewed. Constitutional: He is oriented to person, place, and time. He appears well-developed and well-nourished. No distress.  HENT:  Head: Normocephalic and atraumatic.  Eyes: EOM are normal.  Neck: Normal range of motion. Neck supple. No tracheal deviation present.  Cardiovascular: Normal rate.   Pulmonary/Chest: Effort normal. No respiratory distress.  Musculoskeletal: Normal range of motion. He exhibits tenderness.  DP and PT pulses intact. No obvious deformity. Healed surgical scar noted to left knee. Mild pain with passive ROM. Able to range left knee joint. No increased warmth noted to left knee. Mild tenderness to palpation to medial aspect of left knee.   Neurological: He is alert and oriented to person, place, and time.  Skin: Skin is warm and dry.  Psychiatric: He has a normal mood and affect. His behavior is normal.    ED Course  Procedures (including critical care time)  DIAGNOSTIC STUDIES: Oxygen Saturation is 97% on room air, normal by my interpretation.    COORDINATION OF CARE: 8:14AM- Patient explained current plan for treatment and agrees with plan set forth at this time.    Labs Reviewed - No data to display No results  found.  I personally reviewed the x-ray.  Evidence of medial joint line narrowing without evidence of acute fracture.  1. 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  The patient will be discharged home on a short course of pain medication.  I have continued to emphasize the importance of followup with a primary care physician as the patient has received several prescriptions for pain medication from the    I personally performed the services described in this documentation, which was scribed in my presence. The recorded information has been reviewed and considered.    Lyanne Co, MD 05/09/11 [phone]

## 2011-06-22 ENCOUNTER — Emergency Department (HOSPITAL_COMMUNITY): Payer: Non-veteran care

## 2011-06-22 ENCOUNTER — Encounter (HOSPITAL_COMMUNITY): Payer: Self-pay | Admitting: *Deleted

## 2011-06-22 ENCOUNTER — Emergency Department (HOSPITAL_COMMUNITY)
Admission: EM | Admit: 2011-06-22 | Discharge: 2011-06-22 | Disposition: A | Discharge status: Home Health | Payer: Non-veteran care | Attending: Emergency Medicine | Admitting: Emergency Medicine

## 2011-06-22 DIAGNOSIS — F17200 Nicotine dependence, unspecified, uncomplicated: Secondary | ICD-10-CM | POA: Insufficient documentation

## 2011-06-22 DIAGNOSIS — M25569 Pain in unspecified knee: Secondary | ICD-10-CM

## 2011-06-22 DIAGNOSIS — Z8551 Personal history of malignant neoplasm of bladder: Secondary | ICD-10-CM | POA: Insufficient documentation

## 2011-06-22 DIAGNOSIS — Z8619 Personal history of other infectious and parasitic diseases: Secondary | ICD-10-CM | POA: Insufficient documentation

## 2011-06-22 DIAGNOSIS — J4489 Other specified chronic obstructive pulmonary disease: Secondary | ICD-10-CM | POA: Insufficient documentation

## 2011-06-22 DIAGNOSIS — Y93K1 Activity, walking an animal: Secondary | ICD-10-CM | POA: Insufficient documentation

## 2011-06-22 DIAGNOSIS — S8990XA Unspecified injury of unspecified lower leg, initial encounter: Secondary | ICD-10-CM | POA: Insufficient documentation

## 2011-06-22 DIAGNOSIS — Y998 Other external cause status: Secondary | ICD-10-CM | POA: Insufficient documentation

## 2011-06-22 DIAGNOSIS — W19XXXA Unspecified fall, initial encounter: Secondary | ICD-10-CM | POA: Insufficient documentation

## 2011-06-22 DIAGNOSIS — I10 Essential (primary) hypertension: Secondary | ICD-10-CM | POA: Insufficient documentation

## 2011-06-22 DIAGNOSIS — J449 Chronic obstructive pulmonary disease, unspecified: Secondary | ICD-10-CM | POA: Insufficient documentation

## 2011-06-22 HISTORY — DX: Chronic obstructive pulmonary disease, unspecified: J44.9

## 2011-06-22 MED ORDER — OXYCODONE-ACETAMINOPHEN 5-325 MG PO TABS: 1.0000 | ORAL_TABLET | ORAL | Status: AC | PRN

## 2011-06-22 NOTE — ED Provider Notes (Signed)
History     CSN: [id number]  Arrival date & time 06/22/11  1055   None     Chief Complaint  Victor Davidson presents with  . 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 He has been scheduled for knee replacement, but this [facility]. The Victor Davidson has also been assigned to a pain management clinic, but he has been unable to see that physician yet either. 2 days ago the Victor Davidson was walking with his dog when his knee" went out" and he sustained a fall. He continues to have pain and presents to the emergency department for evaluation. It is of note that the Victor Davidson has a history of hepatitis C, hypertension, bladder cancer. He has tried Neurontin at home but this has not helped.  The history is provided by the Victor Davidson.    Past Medical History  Diagnosis Date  . Hypertension   . Herniated disc   . Hepatitis C   . Urinary bladder cancer   . Hepatitis C   . COPD (chronic obstructive pulmonary disease)     Past Surgical History  Procedure Date  . Knee surgery   . Total hip arthroplasty   . Tonsillectomy     Family History  Problem Relation Age of Onset  . Cancer Mother     History  Substance Use Topics  . Smoking status: Current Everyday Smoker -- 0.2 packs/day for 30 years    Types: Cigarettes  . Smokeless tobacco: Former Neurosurgeon    Types: Chew  . Alcohol Use: Yes     occassional      Review of Systems  Constitutional: Negative for activity change.       All ROS Neg except as noted in HPI  HENT: Negative for nosebleeds and neck pain.   Eyes: Negative for photophobia and discharge.  Respiratory: Positive for cough. Negative for shortness of breath and  wheezing.   Cardiovascular: Negative for chest pain and palpitations.  Gastrointestinal: Negative for abdominal pain and blood in stool.  Genitourinary: Negative for dysuria, frequency and hematuria.  Musculoskeletal: Positive for back pain. Negative for arthralgias.       Knee pain.  Skin: Negative.   Neurological: Negative for dizziness, seizures and speech difficulty.  Psychiatric/Behavioral: Negative for hallucinations and confusion.    Allergies  Toradol  Home Medications   Current Outpatient Rx  Name Route Sig Dispense Refill  . AMLODIPINE BESYLATE 10 MG PO TABS Oral Take 10 mg by mouth daily.     . ASPIRIN EC 81 MG PO TBEC Oral Take 81 mg by mouth daily.    Marland Kitchen FLUTICASONE-SALMETEROL 250-50 MCG/DOSE IN AEPB Inhalation Inhale 1 puff into the lungs every 12 (twelve) hours as needed. For shortness of breath or over exposure to allergens     . GABAPENTIN 300 MG PO CAPS Oral Take 600 mg by mouth 3 (three) times daily.     Marland Kitchen NICOTINE 14 MG/24HR TD PT24 Transdermal Place 1 patch onto the skin daily.    . CYCLOBENZAPRINE HCL 10 MG PO TABS Oral Take 10 mg by mouth 3 (three) times daily as needed. For back and knee pain       BP 138/88  Pulse 112  Temp(Src) 98 F (36.7 C) (Oral)  Resp  22  Ht 5\' 6"  (1.676 m)  Wt 165 lb (74.844 kg)  BMI 26.63 kg/m2  SpO2 99%  Physical Exam  Nursing note and vitals reviewed. Constitutional: He is oriented to person, place, and time. He appears well-developed and well-nourished.  Non-toxic appearance.  HENT:  Head: Normocephalic.  Right Ear: Tympanic membrane and external ear normal.  Left Ear: Tympanic membrane and external ear normal.  Eyes: EOM and lids are normal. Pupils are equal, round, and reactive to light.  Neck: Normal range of motion. Neck supple. Carotid bruit is not present.  Cardiovascular: Normal rate, regular rhythm, normal heart sounds, intact distal pulses and normal pulses.   Pulmonary/Chest: No respiratory distress. He has  rhonchi.  Abdominal: Soft. Bowel sounds are normal. There is no tenderness. There is no guarding.  Musculoskeletal: Normal range of motion.       There is full range of motion of the left ankle and toes. There is no deformity of the tib-fib area. There is pain to palpation of the medial and lateral left knee. There is no effusion noted. The area is not hot.  Lymphadenopathy:       Head (right side): No submandibular adenopathy present.       Head (left side): No submandibular adenopathy present.    He has no cervical adenopathy.  Neurological: He is alert and oriented to person, place, and time. He has normal strength. No cranial nerve deficit or sensory deficit.  Skin: Skin is warm and dry.  Psychiatric: He has a normal mood and affect. His speech is normal.    ED Course  Procedures (including critical care time) Pulse oximetry 99% on room air. Within normal limits by my interpretation. Labs Reviewed - No data to display Dg Knee Complete 4 Views Left  06/22/2011  *RADIOLOGY REPORT*  Clinical Data: Left knee injury.  LEFT KNEE - COMPLETE 4+ VIEW  Comparison: 05/09/2011.  Findings: Stable tricompartmental degenerative changes with joint space narrowing, osteophytic spurring and mild bony eburnation. This is most significant in the medial compartment.  There are surgical changes from patellectomy.  Small calcifications are noted in the patellar tendon.  No definite joint effusion.  IMPRESSION:  1.  Stable tricompartmental degenerative changes. 2.  Stable surgical changes from a patellectomy.  Original Report Authenticated By: P. Loralie Champagne, M.D.     No diagnosis found.    MDM  I have reviewed nursing notes, vital signs, and all appropriate lab and imaging results for this Victor Davidson. The Victor Davidson has history of chronic knee pain. He is scheduled for a knee replacement by the [facility] in [place]. He sustained a fall a few days ago and reinjured the left knee. The  x-rays are negative for fracture or dislocation, and unchanged from x-rays completed in January of 2013. Prescription for Percocet 5 mg #15 given as the Victor Davidson states he cannot tolerate hydrocodone and several other medications.     Kathie Dike, [place] 06/22/11 1227

## 2011-06-22 NOTE — ED Notes (Signed)
Complaint of left knee pain, fell after" knee gave out".  Pt reports extensive surgery on lt knee in past.

## 2011-06-22 NOTE — ED Notes (Signed)
Pt DC to home with steady gait.  In the care of family.

## 2011-06-22 NOTE — Discharge Instructions (Signed)
Your x-rays today have not changed compared to the x-ray she had at the end of January 2013. There is no fracture or dislocation. Please see your physician at the [facility], or ear pain clinic physician for assistance with pain management. Please usual her knee immobilizer when up and about until seen by the orthopedic specialist. Please use caution with Percocet, as it may cause drowsiness.Arthralgia Arthralgia is joint pain. A joint is a place where two bones meet. Joint pain can happen for many reasons. The joint can be bruised, stiff, infected, or weak from aging. Pain usually goes away after resting and taking medicine for soreness.  HOME CARE  Rest the joint as told by your doctor.   Keep the sore joint raised (elevated) for the first 24 hours.   Put ice on the joint area.   Put ice in a plastic bag.   Place a towel between your skin and the bag.   Leave the ice on for 15 to 20 minutes, 3 to 4 times a day.   Wear your splint, casting, elastic bandage, or sling as told by your doctor.   Only take medicine as told by your doctor. Do not take aspirin.   Use crutches as told by your doctor. Do not put weight on the joint until told to by your doctor.  GET HELP RIGHT AWAY IF:   You have bruising, puffiness (swelling), or more pain.   Your fingers or toes turn blue or start to lose feeling (numb).   Your medicine does not lessen the pain.   Your pain becomes severe.   You have a temperature by mouth above 102 F (38.9 C), not controlled by medicine.   You cannot move or use the joint.  MAKE SURE YOU:   Understand these instructions.   Will watch your condition.   Will get help right away if you are not doing well or get worse.  Document Released: 03/19/2009 Document Revised: 03/20/2011 Document Reviewed: 03/19/2009 [facility] Patient Information 2012 [place], [place].

## 2011-06-24 NOTE — ED Provider Notes (Signed)
Medical screening examination/treatment/procedure(s) were performed by non-physician practitioner and as supervising physician I was immediately available for consultation/collaboration. Devoria Albe, MD, Armando Gang   Ward Givens, MD 06/24/11 [phone]

## 2011-08-08 ENCOUNTER — Emergency Department (HOSPITAL_COMMUNITY): Payer: Non-veteran care

## 2011-08-08 ENCOUNTER — Emergency Department (HOSPITAL_COMMUNITY)
Admission: EM | Admit: 2011-08-08 | Discharge: 2011-08-08 | Disposition: A | Discharge status: Home / Self Care | Payer: Non-veteran care | Attending: Emergency Medicine | Admitting: Emergency Medicine

## 2011-08-08 ENCOUNTER — Encounter (HOSPITAL_COMMUNITY): Payer: Self-pay | Admitting: *Deleted

## 2011-08-08 DIAGNOSIS — Z8551 Personal history of malignant neoplasm of bladder: Secondary | ICD-10-CM | POA: Insufficient documentation

## 2011-08-08 DIAGNOSIS — Z7982 Long term (current) use of aspirin: Secondary | ICD-10-CM | POA: Insufficient documentation

## 2011-08-08 DIAGNOSIS — S8000XA Contusion of unspecified knee, initial encounter: Secondary | ICD-10-CM | POA: Insufficient documentation

## 2011-08-08 DIAGNOSIS — J4489 Other specified chronic obstructive pulmonary disease: Secondary | ICD-10-CM | POA: Insufficient documentation

## 2011-08-08 DIAGNOSIS — J449 Chronic obstructive pulmonary disease, unspecified: Secondary | ICD-10-CM | POA: Insufficient documentation

## 2011-08-08 DIAGNOSIS — S8002XA Contusion of left knee, initial encounter: Secondary | ICD-10-CM

## 2011-08-08 DIAGNOSIS — M25569 Pain in unspecified knee: Secondary | ICD-10-CM | POA: Insufficient documentation

## 2011-08-08 DIAGNOSIS — M549 Dorsalgia, unspecified: Secondary | ICD-10-CM | POA: Insufficient documentation

## 2011-08-08 DIAGNOSIS — S8990XA Unspecified injury of unspecified lower leg, initial encounter: Secondary | ICD-10-CM | POA: Insufficient documentation

## 2011-08-08 DIAGNOSIS — Z8619 Personal history of other infectious and parasitic diseases: Secondary | ICD-10-CM | POA: Insufficient documentation

## 2011-08-08 DIAGNOSIS — Z79899 Other long term (current) drug therapy: Secondary | ICD-10-CM | POA: Insufficient documentation

## 2011-08-08 DIAGNOSIS — W138XXA Fall from, out of or through other building or structure, initial encounter: Secondary | ICD-10-CM | POA: Insufficient documentation

## 2011-08-08 DIAGNOSIS — I10 Essential (primary) hypertension: Secondary | ICD-10-CM | POA: Insufficient documentation

## 2011-08-08 DIAGNOSIS — R0602 Shortness of breath: Secondary | ICD-10-CM | POA: Insufficient documentation

## 2011-08-08 MED ORDER — HYDROCODONE-ACETAMINOPHEN 5-325 MG PO TABS: 1.0000 | ORAL_TABLET | ORAL | Status: AC | PRN

## 2011-08-08 NOTE — Discharge Instructions (Signed)
Your xrays are negative for fracture or dislocation.Use 3 ibuprofen after each meal and at bedtime for inflammation.(your xray reveal advanced arthritis of the knee) Use Norco for for severe pain.This may cause drowsiness, use with caution. Please see your MD at the [facility] for recheck and evaluation next week.Contusion A contusion is a deep bruise. Contusions happen when an injury causes bleeding under the skin. Signs of bruising include pain, puffiness (swelling), and discolored skin. The contusion may turn blue, purple, or yellow. HOME CARE   Put ice on the injured area.   Put ice in a plastic bag.   Place a towel between your skin and the bag.   Leave the ice on for 15 to 20 minutes, 3 to 4 times a day.   Only take medicine as told by your doctor.   Rest the injured area.   If possible, raise (elevate) the injured area to lessen puffiness.  GET HELP RIGHT AWAY IF:   You have more bruising or puffiness.   You have pain that is getting worse.   Your puffiness or pain is not helped by medicine.  MAKE SURE YOU:   Understand these instructions.   Will watch your condition.   Will get help right away if you are not doing well or get worse.  Document Released: 09/17/2007 Document Revised: 03/20/2011 Document Reviewed: 02/03/2011 [facility] Patient Information 2012 [place], [place].

## 2011-08-08 NOTE — ED Provider Notes (Signed)
History     CSN: [id number]  Arrival date & time 08/08/11  6213   First MD Initiated Contact with Patient 08/08/11 0914      Chief Complaint  Patient presents with  . Knee Injury    (Consider location/radiation/quality/duration/timing/severity/associated sxs/prior treatment) HPI Comments: Pt has had his patella removed on the left. He was working on the roof of a barn when his leg went through the roof 3 days ago. He continues to have pain and request a check for fracture.  The history is provided by the patient.    Past Medical History  Diagnosis Date  . Hypertension   . Herniated disc   . Hepatitis C   . Urinary bladder cancer   . Hepatitis C   . COPD (chronic obstructive pulmonary disease)     Past Surgical History  Procedure Date  . Knee surgery   . Total hip arthroplasty   . Tonsillectomy     Family History  Problem Relation Age of Onset  . Cancer Mother     History  Substance Use Topics  . Smoking status: Current Everyday Smoker -- 0.2 packs/day for 30 years    Types: Cigarettes  . Smokeless tobacco: Former Neurosurgeon    Types: Chew  . Alcohol Use: Yes     occassional      Review of Systems  Constitutional: Negative for activity change.       All ROS Neg except as noted in HPI  HENT: Negative for nosebleeds and neck pain.   Eyes: Negative for photophobia and discharge.  Respiratory: Positive for shortness of breath. Negative for cough and wheezing.   Cardiovascular: Negative for chest pain and palpitations.  Gastrointestinal: Negative for abdominal pain and blood in stool.  Genitourinary: Negative for dysuria, frequency and hematuria.  Musculoskeletal: Positive for back pain and arthralgias.  Skin: Negative.   Neurological: Negative for dizziness, seizures and speech difficulty.  Psychiatric/Behavioral: Negative for hallucinations and confusion.    Allergies  Toradol  Home Medications   Current Outpatient Rx  Name Route Sig Dispense Refill    . AMLODIPINE BESYLATE 10 MG PO TABS Oral Take 10 mg by mouth daily.     . ASPIRIN EC 81 MG PO TBEC Oral Take 81 mg by mouth daily.    . CYCLOBENZAPRINE HCL 10 MG PO TABS Oral Take 10 mg by mouth 3 (three) times daily as needed. For back and knee pain     . FLUTICASONE-SALMETEROL 250-50 MCG/DOSE IN AEPB Inhalation Inhale 1 puff into the lungs every 12 (twelve) hours as needed. For shortness of breath or over exposure to allergens     . GABAPENTIN 300 MG PO CAPS Oral Take 600 mg by mouth 3 (three) times daily.     Marland Kitchen HYDROCODONE-ACETAMINOPHEN 5-325 MG PO TABS Oral Take 1 tablet by mouth every 4 (four) hours as needed for pain. 15 tablet 0  . NICOTINE 14 MG/24HR TD PT24 Transdermal Place 1 patch onto the skin daily.      BP 147/81  Pulse 84  Temp 99.1 F (37.3 C)  Resp 18  Ht 5\' 6"  (1.676 m)  Wt 165 lb (74.844 kg)  BMI 26.63 kg/m2  SpO2 96%  Physical Exam  Nursing note and vitals reviewed. Constitutional: He is oriented to person, place, and time. He appears well-developed and well-nourished.  Non-toxic appearance.  HENT:  Head: Normocephalic.  Right Ear: Tympanic membrane and external ear normal.  Left Ear: Tympanic membrane and external ear normal.  Eyes: EOM and lids are normal. Pupils are equal, round, and reactive to light.  Neck: Normal range of motion. Neck supple. Carotid bruit is not present.  Cardiovascular: Normal rate, regular rhythm, normal heart sounds, intact distal pulses and normal pulses.   Pulmonary/Chest: Breath sounds normal. No respiratory distress.  Abdominal: Soft. Bowel sounds are normal. There is no tenderness. There is no guarding.  Musculoskeletal: Normal range of motion.       Left knee pain with attempted ROM. No palpable deformity.  Distal pulses wnl. Good ROM of the left hip.  Lymphadenopathy:       Head (right side): No submandibular adenopathy present.       Head (left side): No submandibular adenopathy present.    He has no cervical adenopathy.   Neurological: He is alert and oriented to person, place, and time. He has normal strength. No cranial nerve deficit or sensory deficit.  Skin: Skin is warm and dry.  Psychiatric: He has a normal mood and affect. His speech is normal.    ED Course  Procedures (including critical care time)  Labs Reviewed - No data to display Dg Knee Complete 4 Views Left  08/08/2011  *RADIOLOGY REPORT*  Clinical Data: Knee injury.  LEFT KNEE - COMPLETE 4+ VIEW  Comparison: Left knee radiographs dated 06/22/2011.  Findings: Postoperative changes of prior patellectomy are again noted.  There are degenerative changes in the knee joint, most profound in the medial compartment where there is joint space narrowing, subchondral sclerosis, subchondral cyst formation and osteophyte formation, compatible with osteoarthritis.  No acute fracture, subluxation or dislocation is noted.  Small ossific fragments are again noted in the region of the patellar tendon.  IMPRESSION: 1.  No acute radiographic abnormality of the left knee. 2.  Degenerative changes of osteoarthritis, most severe in the medial compartment, again noted.  Original Report Authenticated By: Florencia Reasons, M.D.   Pulse Ox 96% on RA. WNL by my interpretation.  1. Contusion of knee, left       MDM  I have reviewed nursing notes, vital signs, and all appropriate lab and imaging results for this patient. Knee xray is neg for fx or dislocation. DJD changes noted.Pt fitted with immobilizer. Rx for Norco given for pain.       Kathie Dike, [place] 08/10/11 1649

## 2011-08-08 NOTE — ED Notes (Signed)
Pt c/o left knee pain, pt states that he was working on his rafters on Tuesday when his knee went through the ceiling, pt c/o pain to left knee area, states that the swelling is better but continues to have pain. Cms intact distal.

## 2011-09-15 NOTE — ED Provider Notes (Signed)
Medical screening examination/treatment/procedure(s) were performed by non-physician practitioner and as supervising physician I was immediately available for consultation/collaboration.   Aracelis Ulrey M Krystale Rinkenberger, MD 09/15/11 2350 

## 2011-10-01 ENCOUNTER — Encounter (HOSPITAL_COMMUNITY): Payer: Self-pay | Admitting: Emergency Medicine

## 2011-10-01 ENCOUNTER — Emergency Department (HOSPITAL_COMMUNITY)
Admission: EM | Admit: 2011-10-01 | Discharge: 2011-10-01 | Disposition: A | Discharge status: Home / Self Care | Payer: Medicaid Other | Attending: Emergency Medicine | Admitting: Emergency Medicine

## 2011-10-01 DIAGNOSIS — J449 Chronic obstructive pulmonary disease, unspecified: Secondary | ICD-10-CM | POA: Insufficient documentation

## 2011-10-01 DIAGNOSIS — Z79899 Other long term (current) drug therapy: Secondary | ICD-10-CM | POA: Insufficient documentation

## 2011-10-01 DIAGNOSIS — I10 Essential (primary) hypertension: Secondary | ICD-10-CM | POA: Insufficient documentation

## 2011-10-01 DIAGNOSIS — Z886 Allergy status to analgesic agent status: Secondary | ICD-10-CM | POA: Insufficient documentation

## 2011-10-01 DIAGNOSIS — Z7982 Long term (current) use of aspirin: Secondary | ICD-10-CM | POA: Insufficient documentation

## 2011-10-01 DIAGNOSIS — J4489 Other specified chronic obstructive pulmonary disease: Secondary | ICD-10-CM | POA: Insufficient documentation

## 2011-10-01 DIAGNOSIS — M549 Dorsalgia, unspecified: Secondary | ICD-10-CM

## 2011-10-01 DIAGNOSIS — M545 Low back pain, unspecified: Secondary | ICD-10-CM | POA: Insufficient documentation

## 2011-10-01 DIAGNOSIS — X500XXA Overexertion from strenuous movement or load, initial encounter: Secondary | ICD-10-CM | POA: Insufficient documentation

## 2011-10-01 DIAGNOSIS — Z87891 Personal history of nicotine dependence: Secondary | ICD-10-CM | POA: Insufficient documentation

## 2011-10-01 DIAGNOSIS — B182 Chronic viral hepatitis C: Secondary | ICD-10-CM | POA: Insufficient documentation

## 2011-10-01 MED ORDER — OXYCODONE-ACETAMINOPHEN 5-325 MG PO TABS: 1.0000 | ORAL_TABLET | ORAL | Status: AC | PRN

## 2011-10-01 NOTE — Discharge Instructions (Signed)

## 2011-10-01 NOTE — ED Notes (Signed)
Patient states he was moving hay bales yesterday and started having lower back pain. Patient states he has history of bulging disc in his back.

## 2011-10-01 NOTE — ED Provider Notes (Signed)
History  This chart was scribed for Victor Gaskins, MD by Bennett Scrape. This patient was seen in room APA03/APA03 and the patient's care was started at 7:00AM.  CSN: [id number]  Arrival date & time 10/01/11  8295   First MD Initiated Contact with Patient 10/01/11 0700      Chief Complaint  Patient presents with  . Back Pain     Patient is a 61 y.o. male presenting with back pain. The history is provided by the patient. No language interpreter was used.  Back Pain  This is a recurrent problem. The current episode started yesterday. The problem occurs constantly. The problem has been gradually worsening. The pain is associated with lifting heavy objects. The pain is present in the lumbar spine. The pain radiates to the left thigh, left knee and left foot. Pertinent negatives include no abdominal pain and no dysuria. He has tried nothing for the symptoms.    Victor Davidson is a 61 y.o. male with a h/o back pain who presents to the Emergency Department complaining of sudden onset, gradually worsening, constant lower back pain that radiates down the left leg. Pt states that the back pain started yesterday after he turned suddenly while carrying two bales of hay. The pain is worse with movement. He denies taking OTC medications at home to improve symptoms. He denies having prior back surgery because of his h/o hep C. He denies new weakness in his legs, dysuria, abdominal pain and urinary or bowel incontinence as associated symptoms. He also has a h/o HTN, COPD. Pt is a former smoker and occasional alcohol user.   Past Medical History  Diagnosis Date  . Hypertension   . Herniated disc   . Hepatitis C   . Urinary bladder cancer   . Hepatitis C   . COPD (chronic obstructive pulmonary disease)     Past Surgical History  Procedure Date  . Knee surgery   . Total hip arthroplasty   . Tonsillectomy     Family History  Problem Relation Age of Onset  . Cancer Mother     History    Substance Use Topics  . Smoking status: Former Smoker -- 30 years  . Smokeless tobacco: Former Neurosurgeon    Types: Chew  . Alcohol Use: Yes     occassional      Review of Systems  Gastrointestinal: Negative for abdominal pain.  Genitourinary: Negative for dysuria.  Musculoskeletal: Positive for back pain.    Allergies  Ketorolac tromethamine  Home Medications   Current Outpatient Rx  Name Route Sig Dispense Refill  . AMLODIPINE BESYLATE 10 MG PO TABS Oral Take 10 mg by mouth daily.     . ASPIRIN EC 81 MG PO TBEC Oral Take 81 mg by mouth daily.    . CYCLOBENZAPRINE HCL 10 MG PO TABS Oral Take 10 mg by mouth 3 (three) times daily as needed. For back and knee pain     . FLUTICASONE-SALMETEROL 250-50 MCG/DOSE IN AEPB Inhalation Inhale 1 puff into the lungs every 12 (twelve) hours as needed. For shortness of breath or over exposure to allergens     . GABAPENTIN 300 MG PO CAPS Oral Take 600 mg by mouth 3 (three) times daily.     Marland Kitchen NICOTINE 14 MG/24HR TD PT24 Transdermal Place 1 patch onto the skin daily.      Triage Vitals: BP 144/81  Pulse 61  Temp 97.7 F (36.5 C) (Oral)  Resp 16  Ht 5\' 6"  (  1.676 m)  Wt 160 lb (72.576 kg)  BMI 25.82 kg/m2  SpO2 97%  Physical Exam  Nursing note and vitals reviewed.  CONSTITUTIONAL: Well developed/well nourished HEAD AND FACE: Normocephalic/atraumatic EYES: EOMI/PERRL ENMT: Mucous membranes moist NECK: supple no meningeal signs SPINE:  Lumbar paraspinal tenderness, No bruising/crepitance/stepoffs noted to spine CV: S1/S2 noted, no murmurs/rubs/gallops noted LUNGS: Lungs are clear to auscultation bilaterally, no apparent distress ABDOMEN: soft, nontender, no rebound or guarding GU:no cva tenderness NEURO: Awake/alert, equal distal motor: hip flexion/knee flexion/extension, ankle dorsi/plantar flexion, great toe extension intact bilaterally, no clonus bilaterally, plantar reflex appropriate, no apparent sensory deficit in any dermatome.   Equal patellar/achilles reflex noted.  Pt is able to ambulate. EXTREMITIES: pulses normal, full ROM SKIN: warm, color normal PSYCH: no abnormalities of mood noted  ED Course  Procedures   DIAGNOSTIC STUDIES: Oxygen Saturation is 97% on room air, adequate by my interpretation.    COORDINATION OF CARE: 7:08AM-Discussed discharge plan of prescription pain medications. Pt drove himself here, so he will not get a dose before discharge. Advised pt to follow up with his PCP.      MDM  Nursing notes including past medical history and social history reviewed and considered in documentation Previous records reviewed and considered - ED visits Narcotic database reviewed       I personally performed the services described in this documentation, which was scribed in my presence. The recorded information has been reviewed and considered.      Victor Gaskins, MD 10/01/11 [id number]

## 2011-11-11 DIAGNOSIS — F10231 Alcohol dependence with withdrawal delirium: Secondary | ICD-10-CM

## 2011-12-05 ENCOUNTER — Emergency Department (HOSPITAL_COMMUNITY): Payer: Non-veteran care

## 2011-12-05 ENCOUNTER — Emergency Department (HOSPITAL_COMMUNITY)
Admission: EM | Admit: 2011-12-05 | Discharge: 2011-12-05 | Disposition: A | Discharge status: Home / Self Care | Payer: Non-veteran care | Attending: Emergency Medicine | Admitting: Emergency Medicine

## 2011-12-05 ENCOUNTER — Encounter (HOSPITAL_COMMUNITY): Payer: Self-pay | Admitting: *Deleted

## 2011-12-05 DIAGNOSIS — J449 Chronic obstructive pulmonary disease, unspecified: Secondary | ICD-10-CM | POA: Insufficient documentation

## 2011-12-05 DIAGNOSIS — M79609 Pain in unspecified limb: Secondary | ICD-10-CM | POA: Insufficient documentation

## 2011-12-05 DIAGNOSIS — G8918 Other acute postprocedural pain: Secondary | ICD-10-CM

## 2011-12-05 DIAGNOSIS — J4489 Other specified chronic obstructive pulmonary disease: Secondary | ICD-10-CM | POA: Insufficient documentation

## 2011-12-05 DIAGNOSIS — IMO0001 Reserved for inherently not codable concepts without codable children: Secondary | ICD-10-CM | POA: Insufficient documentation

## 2011-12-05 DIAGNOSIS — I10 Essential (primary) hypertension: Secondary | ICD-10-CM | POA: Insufficient documentation

## 2011-12-05 LAB — CBC WITH DIFFERENTIAL/PLATELET
Basophils Absolute: 0 10*3/uL (ref 0.0–0.1)
Basophils Relative: 0 % (ref 0–1)
Eosinophils Absolute: 0.3 10*3/uL (ref 0.0–0.7)
Eosinophils Relative: 3 % (ref 0–5)
MCH: 33.7 pg (ref 26.0–34.0)
MCHC: 35.4 g/dL (ref 30.0–36.0)
MCV: 95 fL (ref 78.0–100.0)
Neutrophils Relative %: 65 % (ref 43–77)
Platelets: 61 10*3/uL — ABNORMAL LOW (ref 150–400)
RBC: 3.98 MIL/uL — ABNORMAL LOW (ref 4.22–5.81)
RDW: 13 % (ref 11.5–15.5)

## 2011-12-05 LAB — BASIC METABOLIC PANEL
Calcium: 9.1 mg/dL (ref 8.4–10.5)
GFR calc Af Amer: >90 mL/min (ref >90)
GFR calc non Af Amer: >90 mL/min (ref >90)
Potassium: 4.2 mEq/L (ref 3.5–5.1)
Sodium: 135 mEq/L (ref 135–145)

## 2011-12-05 MED ORDER — PIPERACILLIN-TAZOBACTAM 3.375 G IVPB
3.3750 g | Freq: Once | INTRAVENOUS | Status: AC
  Administered 2011-12-05: 3.375 g via INTRAVENOUS
  Filled 2011-12-05: qty 50

## 2011-12-05 MED ORDER — CEPHALEXIN 500 MG PO CAPS: 500.0000 mg | ORAL_CAPSULE | Freq: Four times a day (QID) | ORAL | Status: AC

## 2011-12-05 MED ORDER — HYDROMORPHONE HCL PF 1 MG/ML IJ SOLN
1.0000 mg | Freq: Once | INTRAMUSCULAR | Status: AC
  Administered 2011-12-05: 1 mg via INTRAVENOUS
  Filled 2011-12-05: qty 1

## 2011-12-05 MED ORDER — SODIUM CHLORIDE 0.9 % IV BOLUS (SEPSIS)
500.0000 mL | Freq: Once | INTRAVENOUS | Status: AC
  Administered 2011-12-05: 21:00:00 via INTRAVENOUS

## 2011-12-05 NOTE — ED Notes (Signed)
Surgery to lt ankle for Fx  At [facility],  7/26  Now having drainage of pus from site.  Has been wearing a boot.

## 2011-12-05 NOTE — ED Notes (Signed)
MD at bedside.- Dr Rosalia Hammers discussed treatment plan and discharge with patient.

## 2011-12-05 NOTE — ED Provider Notes (Signed)
History     CSN: [id number]  Arrival date & time 12/05/11  1756   First MD Initiated Contact with Patient 12/05/11 1810      Chief Complaint  Patient presents with  . Leg Pain    (Consider location/radiation/quality/duration/timing/severity/associated sxs/prior treatment) HPI  Patient states he fractured his ankle on 725 had surgery with an internal fixator placed on 726. He states he was seen by his orthopedist and had the nipples out and splint off 2 days ago. He's been wearing a Cam Walker. He states here he is now red and has some drainage and is increasingly painful on the lateral aspect. He attempted to contact his orthopedist at [facility] today but states that there is nobody there to be answering calls. He came in secondary to this.  Past Medical History  Diagnosis Date  . Hypertension   . Herniated disc   . Hepatitis C   . Hepatitis C   . COPD (chronic obstructive pulmonary disease)     Past Surgical History  Procedure Date  . Knee surgery   . Total hip arthroplasty   . Tonsillectomy   . Joint replacement   . Ankle surgery     Family History  Problem Relation Age of Onset  . Cancer Mother     History  Substance Use Topics  . Smoking status: Current Everyday Smoker -- 30 years  . Smokeless tobacco: Former Neurosurgeon    Types: Chew  . Alcohol Use: No     occassional      Review of Systems  All other systems reviewed and are negative.    Allergies  Ketorolac tromethamine  Home Medications   Current Outpatient Rx  Name Route Sig Dispense Refill  . AMLODIPINE BESYLATE 10 MG PO TABS Oral Take 10 mg by mouth daily.     . ASPIRIN EC 81 MG PO TBEC Oral Take 81 mg by mouth daily.    . CYCLOBENZAPRINE HCL 10 MG PO TABS Oral Take 10 mg by mouth 3 (three) times daily as needed. For back and knee pain     . FLUTICASONE-SALMETEROL 250-50 MCG/DOSE IN AEPB Inhalation Inhale 1 puff into the lungs every 12 (twelve) hours as needed. For shortness of breath or over  exposure to allergens     . GABAPENTIN 300 MG PO CAPS Oral Take 600 mg by mouth 3 (three) times daily.     Marland Kitchen NICOTINE 14 MG/24HR TD PT24 Transdermal Place 1 patch onto the skin daily.      Pulse 79  Temp 98.5 F (36.9 C) (Oral)  Resp 20  Ht 5' 6.5" (1.689 m)  Wt 165 lb (74.844 kg)  BMI 26.23 kg/m2  SpO2 99%  Physical Exam  Nursing note and vitals reviewed. Constitutional: He is oriented to person, place, and time. He appears well-developed and well-nourished.  HENT:  Head: Normocephalic and atraumatic.  Eyes: Conjunctivae and EOM are normal. Pupils are equal, round, and reactive to light.  Neck: Normal range of motion.  Cardiovascular: Normal rate and regular rhythm.   Pulmonary/Chest: Effort normal.  Abdominal: Soft.  Musculoskeletal:       Mildly erythematous well healing suture on the outer lateral aspect of the left ankle. He is only mildly tender in this area. It is not warm. Erythema extends about 1 cm from the wound edges. There is some discolored drainage nose and the inferior aspect but it is only a very small amount if pressed hard. There is no fluctuance noted to this.  The ankle itself does not appear tender.  Neurological: He is alert and oriented to person, place, and time.  Skin: Skin is warm and dry.  Psychiatric: He has a normal mood and affect.    ED Course  Procedures (including critical care time)  Labs Reviewed  CBC WITH DIFFERENTIAL - Abnormal; Notable for the following:    WBC 11.0 (*)     RBC 3.98 (*)     HCT 37.8 (*)     Platelets 61 (*)     Monocytes Absolute 1.3 (*)     All other components within normal limits  BASIC METABOLIC PANEL - Abnormal; Notable for the following:    Glucose, Bld 109 (*)     BUN 5 (*)     All other components within normal limits   Dg Ankle Complete Left  12/05/2011  *RADIOLOGY REPORT*  Clinical Data: Leg pain, prior surgery  LEFT ANKLE COMPLETE - 3+ VIEW  Comparison: 11/07/2011  Findings: Status post ORIF of  bimalleolar fractures.  No evidence of hardware fracture or loosening.  Medial malleolar fracture remains mildly displaced.  The base of the fifth metatarsal is unremarkable.  Mild diffuse soft tissue swelling.  Removal of overlying cast and skin staples.  IMPRESSION: Status post ORIF of bimalleolar fractures.  No evidence of hardware fracture or loosening.  Medial malleolar fracture remains mildly displaced.  Mild diffuse soft tissue swelling.   Original Report Authenticated By: Charline Bills, M.D.      No diagnosis found.    MDM  Patient given Zosyn thank you year. He has normal labs and no evidence of osteo- seen on x-Mariell Nester. I did attempt to contact Dr. Case but according to the secretary here there is nobody on call for them this weekend. The patient is placed on Keflex and advised to recheck if it is not improving or is worse anytime otherwise his fall with Dr. Case on Monday. He is to avoid the cam walker for his sole used in ambulate. He is to wear the Cam Walker but is to revert to using crutches until he is followed up by Dr. Case.        Hilario Quarry, MD 12/05/11 2108

## 2012-01-04 ENCOUNTER — Emergency Department (HOSPITAL_COMMUNITY): Payer: Non-veteran care

## 2012-01-04 ENCOUNTER — Encounter (HOSPITAL_COMMUNITY): Payer: Self-pay

## 2012-01-04 ENCOUNTER — Emergency Department (HOSPITAL_COMMUNITY)
Admission: EM | Admit: 2012-01-04 | Discharge: 2012-01-04 | Disposition: A | Discharge status: Home / Self Care | Payer: Non-veteran care | Attending: Emergency Medicine | Admitting: Emergency Medicine

## 2012-01-04 DIAGNOSIS — J449 Chronic obstructive pulmonary disease, unspecified: Secondary | ICD-10-CM | POA: Insufficient documentation

## 2012-01-04 DIAGNOSIS — S92919A Unspecified fracture of unspecified toe(s), initial encounter for closed fracture: Secondary | ICD-10-CM

## 2012-01-04 DIAGNOSIS — I10 Essential (primary) hypertension: Secondary | ICD-10-CM | POA: Insufficient documentation

## 2012-01-04 DIAGNOSIS — F17200 Nicotine dependence, unspecified, uncomplicated: Secondary | ICD-10-CM | POA: Insufficient documentation

## 2012-01-04 DIAGNOSIS — T8131XA Disruption of external operation (surgical) wound, not elsewhere classified, initial encounter: Secondary | ICD-10-CM

## 2012-01-04 DIAGNOSIS — Z96649 Presence of unspecified artificial hip joint: Secondary | ICD-10-CM | POA: Insufficient documentation

## 2012-01-04 DIAGNOSIS — Y838 Other surgical procedures as the cause of abnormal reaction of the patient, or of later complication, without mention of misadventure at the time of the procedure: Secondary | ICD-10-CM | POA: Insufficient documentation

## 2012-01-04 DIAGNOSIS — B1920 Unspecified viral hepatitis C without hepatic coma: Secondary | ICD-10-CM | POA: Insufficient documentation

## 2012-01-04 DIAGNOSIS — J4489 Other specified chronic obstructive pulmonary disease: Secondary | ICD-10-CM | POA: Insufficient documentation

## 2012-01-04 LAB — CBC WITH DIFFERENTIAL/PLATELET
Basophils Relative: 2 % — ABNORMAL HIGH (ref 0–1)
Eosinophils Absolute: 0.4 10*3/uL (ref 0.0–0.7)
Hemoglobin: 13.6 g/dL (ref 13.0–17.0)
Lymphs Abs: 1.6 10*3/uL (ref 0.7–4.0)
Monocytes Relative: 12 % (ref 3–12)
Neutro Abs: 2.1 10*3/uL (ref 1.7–7.7)
Neutrophils Relative %: 45 % (ref 43–77)
Platelets: 111 10*3/uL — ABNORMAL LOW (ref 150–400)
RBC: 4.17 MIL/uL — ABNORMAL LOW (ref 4.22–5.81)

## 2012-01-04 LAB — BASIC METABOLIC PANEL
BUN: 4 mg/dL — ABNORMAL LOW (ref 6–23)
Chloride: 105 mEq/L (ref 96–112)
GFR calc Af Amer: >90 mL/min (ref >90)
GFR calc non Af Amer: >90 mL/min (ref >90)
Glucose, Bld: 104 mg/dL — ABNORMAL HIGH (ref 70–99)
Potassium: 3.7 mEq/L (ref 3.5–5.1)
Sodium: 137 mEq/L (ref 135–145)

## 2012-01-04 MED ORDER — AMOXICILLIN-POT CLAVULANATE 875-125 MG PO TABS: 1.0000 | ORAL_TABLET | Freq: Two times a day (BID) | ORAL | Status: DC

## 2012-01-04 MED ORDER — PIPERACILLIN-TAZOBACTAM 3.375 G IVPB
3.3750 g | Freq: Once | INTRAVENOUS | Status: DC
  Filled 2012-01-04: qty 50

## 2012-01-04 MED ORDER — HYDROCODONE-ACETAMINOPHEN 5-325 MG PO TABS: 2.0000 | ORAL_TABLET | ORAL | Status: DC | PRN

## 2012-01-04 MED ORDER — ONDANSETRON HCL 4 MG/2ML IJ SOLN
4.0000 mg | Freq: Once | INTRAMUSCULAR | Status: AC
  Administered 2012-01-04: 4 mg via INTRAVENOUS
  Filled 2012-01-04: qty 2

## 2012-01-04 MED ORDER — MORPHINE SULFATE 4 MG/ML IJ SOLN
4.0000 mg | Freq: Once | INTRAMUSCULAR | Status: AC
  Administered 2012-01-04: 4 mg via INTRAVENOUS
  Filled 2012-01-04: qty 1

## 2012-01-04 NOTE — ED Notes (Signed)
Pt had ankle surgery 2 months ago, last night "knot " came up on left ankle, is now draining and thinks screw may have come out.

## 2012-01-04 NOTE — ED Notes (Signed)
Left leg wound cleansed and wrapped with a dressing. Second toe on left foot buddy taped. Pt verbalized wound care instructions. Pt tolerated well/

## 2012-01-04 NOTE — ED Provider Notes (Addendum)
History   This chart was scribed for Glynn Octave, MD by Charolett Bumpers . The patient was seen in room APA12/APA12. Patient's care was started at 1036.    CSN: [id number]  Arrival date & time 01/04/12  1014   First MD Initiated Contact with Patient 01/04/12 1036      Chief Complaint  Patient presents with  . Ankle Pain    (Consider location/radiation/quality/duration/timing/severity/associated sxs/prior treatment) HPI Victor Davidson is a 61 y.o. male who presents to the Emergency Department complaining of constant, moderate left ankle pain with associated swelling and drainage. Pt reports that he had surgery 8 weeks ago at [facility] for an ankle fracture. Pt reports that he had staples. Pt reports that the swelling started 3 days ago and opened up last night and started draining yellow pus and blood. He states that the redness is also worsening. Pt also reports his pain is aggravated with walking. He has been using sterile guaze and a loose ace bandage as treatment. He denies any fevers, vomiting. Denies any h/o DM.   Past Medical History  Diagnosis Date  . Hypertension   . Herniated disc   . Hepatitis C   . Hepatitis C   . COPD (chronic obstructive pulmonary disease)     Past Surgical History  Procedure Date  . Knee surgery   . Total hip arthroplasty   . Tonsillectomy   . Joint replacement   . Ankle surgery     Family History  Problem Relation Age of Onset  . Cancer Mother     History  Substance Use Topics  . Smoking status: Current Every Day Smoker -- 30 years  . Smokeless tobacco: Former Neurosurgeon    Types: Chew  . Alcohol Use: No     occassional      Review of Systems A complete 10 system review of systems was obtained and all systems are negative except as noted in the HPI and PMH.   Allergies  Ketorolac tromethamine  Home Medications   Current Outpatient Rx  Name Route Sig Dispense Refill  . AMLODIPINE BESYLATE 10 MG PO TABS Oral  Take 10 mg by mouth daily.     . ASPIRIN EC 81 MG PO TBEC Oral Take 81 mg by mouth daily.    Marland Kitchen GABAPENTIN 300 MG PO CAPS Oral Take 600 mg by mouth daily as needed. For nerve pain    . ADULT MULTIVITAMIN W/MINERALS CH Oral Take 1 tablet by mouth daily.    Marland Kitchen NAPROXEN SODIUM 220 MG PO TABS Oral Take 220 mg by mouth daily as needed. For pain    . NICOTINE 7 MG/24HR TD PT24 Transdermal Place 1 patch onto the skin daily.      BP 154/79  Pulse 75  Temp 98.3 F (36.8 C) (Oral)  Resp 20  Ht 5\' 6"  (1.676 m)  Wt 160 lb (72.576 kg)  BMI 25.82 kg/m2  SpO2 100%  Physical Exam  Nursing note and vitals reviewed. Constitutional: He is oriented to person, place, and time. He appears well-developed and well-nourished. No distress.  HENT:  Head: Normocephalic and atraumatic.  Eyes: EOM are normal. Pupils are equal, round, and reactive to light.  Neck: Normal range of motion. Neck supple. No tracheal deviation present.  Cardiovascular: Normal rate, regular rhythm and normal heart sounds.   Pulmonary/Chest: Effort normal and breath sounds normal. No respiratory distress. He has no wheezes.  Abdominal: Soft. He exhibits no distension.  Musculoskeletal: Normal range of  motion. He exhibits no edema.  Neurological: He is alert and oriented to person, place, and time.  Skin: Skin is warm and dry.       Ecchymosis to 2nd toe on left foot. Punctual opening of superior end of incision on the lateral left ankle. Serosanguineous drainage, but not purulent, expressed with palpation. 1 cm of surrounding erythema.   Psychiatric: He has a normal mood and affect. His behavior is normal.    ED Course  Procedures (including critical care time)  DIAGNOSTIC STUDIES: Oxygen Saturation is 100% on room air, normal by my interpretation.    COORDINATION OF CARE:  10:52-Discussed planned course of treatment with the patient, who is agreeable at this time.   10:57-Consultation with Dr. Case. Discussed pt's case. Will  place on antibiotics and and x-ray  11:15-Medication Orders: Piperacillin-tazobactam (Zosyn) IVPB 3.375 g-once; Morphine 4 mg/mL injection 4 mg-once; Ondansetron (Zofran) injection 4 mg-once.   Results for orders placed during the hospital encounter of 01/04/12  CBC WITH DIFFERENTIAL      Component Value Range   WBC 4.6  4.0 - 10.5 K/uL   RBC 4.17 (*) 4.22 - 5.81 MIL/uL   Hemoglobin 13.6  13.0 - 17.0 g/dL   HCT 16.1 (*) 09.6 - 04.5 %   MCV 93.3  78.0 - 100.0 fL   MCH 32.6  26.0 - 34.0 pg   MCHC 35.0  30.0 - 36.0 g/dL   RDW 40.9  81.1 - 91.4 %   Platelets 111 (*) 150 - 400 K/uL   Neutrophils Relative 45  43 - 77 %   Neutro Abs 2.1  1.7 - 7.7 K/uL   Lymphocytes Relative 34  12 - 46 %   Lymphs Abs 1.6  0.7 - 4.0 K/uL   Monocytes Relative 12  3 - 12 %   Monocytes Absolute 0.6  0.1 - 1.0 K/uL   Eosinophils Relative 8 (*) 0 - 5 %   Eosinophils Absolute 0.4  0.0 - 0.7 K/uL   Basophils Relative 2 (*) 0 - 1 %   Basophils Absolute 0.1  0.0 - 0.1 K/uL  BASIC METABOLIC PANEL      Component Value Range   Sodium 137  135 - 145 mEq/L   Potassium 3.7  3.5 - 5.1 mEq/L   Chloride 105  96 - 112 mEq/L   CO2 24  19 - 32 mEq/L   Glucose, Bld 104 (*) 70 - 99 mg/dL   BUN 4 (*) 6 - 23 mg/dL   Creatinine, Ser 7.82  0.50 - 1.35 mg/dL   Calcium 8.7  8.4 - 95.6 mg/dL   GFR calc non Af Amer >90  >90 mL/min   GFR calc Af Amer >90  >90 mL/min     Dg Ankle Complete Left  01/04/2012  *RADIOLOGY REPORT*  Clinical Data: Recent ORIF of the ankle, now with open wound.  LEFT ANKLE COMPLETE - 3+ VIEW  Comparison: 12/05/2011; 11/07/2011  Findings:  Stable sequela of side plate fixation of previously identified distal fibular fracture.  No definite evidence of hardware failure or loosening.  Stable sequela of lag screw fixation of medial malleolar fracture. There is a minimal amount of lucency about the shaft of the transfixing lag screws, nonspecific but may be seen in the setting of hardware loosening.  There is  minimal callus formation about the distal aspect of the tibial metaphysis.  There is a persistent lucency at the medial malleolar fracture site.  No change in alignment of  the fracture fragments.  Regional soft tissues are normal.  No definite subcutaneous emphysema.  No radiopaque foreign body.  There is no discrete evidence of osteolysis to suggest osteomyelitis.  IMPRESSION: 1. No radiopaque foreign body or definite evidence of osteolysis to suggest osteomyelitis. 2.  Minimal lucency about the transfixing the medial malleolar lag screws, nonspecific but may be seen in the setting of hardware failure.  Continued attention on follow-up is recommended. 3.  Minimal callus formation about the distal tibial metaphysis with persistent visible fracture line involving the medial malleolus.  No change alignment of the fracture fragments. 4.  Stable sequela of side plate fixation of the distal fibula.   Original Report Authenticated By: Waynard Reeds, M.D.      No diagnosis found.    MDM  Drainage from surgical site on L lateral ankle after ORIF 2 months ago.  No fever, vomiting.  Punctate area of dehiscence with serosanguinous drainage. No purulence expressed on palpation. Ecchymosis to 2nd toe. FROM of ankle and does not appear septic joint.  D/w Dr. Case.  No evidence of osteomyelitis or abscess. Will prescribe antibiotics and he will see patient in office this week.  I personally performed the services described in this documentation, which was scribed in my presence.  The recorded information has been reviewed and considered.       Glynn Octave, MD 01/04/12 1347  Glynn Octave, MD 01/04/12 [phone]

## 2012-01-04 NOTE — ED Notes (Signed)
Pt c/o pain, swelling, knot to left ankle, surgical incision that is now draining, pt states that he had ankle surgery two months ago at [facility], this past Thursday pt noticed that he was having pain to left ankle, the incision is ?splitting now due to the "knot", pt has redness, swelling, pain to left ankle, cms intact, pt also has bruising to second toe on left foot from hitting the table a few nights ago. Dr. Manus Gunning at bedside,

## 2012-02-08 ENCOUNTER — Encounter (HOSPITAL_COMMUNITY): Payer: Self-pay | Admitting: Emergency Medicine

## 2012-02-08 ENCOUNTER — Emergency Department (HOSPITAL_COMMUNITY)
Admission: EM | Admit: 2012-02-08 | Discharge: 2012-02-08 | Disposition: A | Discharge status: Home / Self Care | Payer: Medicaid Other | Attending: Emergency Medicine | Admitting: Emergency Medicine

## 2012-02-08 ENCOUNTER — Emergency Department (HOSPITAL_COMMUNITY): Payer: Medicaid Other

## 2012-02-08 DIAGNOSIS — B1920 Unspecified viral hepatitis C without hepatic coma: Secondary | ICD-10-CM | POA: Insufficient documentation

## 2012-02-08 DIAGNOSIS — J4489 Other specified chronic obstructive pulmonary disease: Secondary | ICD-10-CM | POA: Insufficient documentation

## 2012-02-08 DIAGNOSIS — Z79899 Other long term (current) drug therapy: Secondary | ICD-10-CM | POA: Insufficient documentation

## 2012-02-08 DIAGNOSIS — I10 Essential (primary) hypertension: Secondary | ICD-10-CM | POA: Insufficient documentation

## 2012-02-08 DIAGNOSIS — M25572 Pain in left ankle and joints of left foot: Secondary | ICD-10-CM

## 2012-02-08 DIAGNOSIS — F17200 Nicotine dependence, unspecified, uncomplicated: Secondary | ICD-10-CM | POA: Insufficient documentation

## 2012-02-08 DIAGNOSIS — Z8739 Personal history of other diseases of the musculoskeletal system and connective tissue: Secondary | ICD-10-CM | POA: Insufficient documentation

## 2012-02-08 DIAGNOSIS — M25579 Pain in unspecified ankle and joints of unspecified foot: Secondary | ICD-10-CM | POA: Insufficient documentation

## 2012-02-08 DIAGNOSIS — Z9660 Presence of unspecified orthopedic joint implant: Secondary | ICD-10-CM | POA: Insufficient documentation

## 2012-02-08 DIAGNOSIS — L089 Local infection of the skin and subcutaneous tissue, unspecified: Secondary | ICD-10-CM | POA: Insufficient documentation

## 2012-02-08 DIAGNOSIS — J449 Chronic obstructive pulmonary disease, unspecified: Secondary | ICD-10-CM | POA: Insufficient documentation

## 2012-02-08 DIAGNOSIS — Z7982 Long term (current) use of aspirin: Secondary | ICD-10-CM | POA: Insufficient documentation

## 2012-02-08 DIAGNOSIS — Z9889 Other specified postprocedural states: Secondary | ICD-10-CM | POA: Insufficient documentation

## 2012-02-08 MED ORDER — CEPHALEXIN 500 MG PO CAPS
500.0000 mg | ORAL_CAPSULE | Freq: Once | ORAL | Status: AC
  Administered 2012-02-08: 500 mg via ORAL
  Filled 2012-02-08: qty 1

## 2012-02-08 MED ORDER — HYDROCODONE-ACETAMINOPHEN 5-325 MG PO TABS
1.0000 | ORAL_TABLET | Freq: Four times a day (QID) | ORAL | Status: AC | PRN
Start: 2012-02-08 — End: 2012-02-18

## 2012-02-08 MED ORDER — HYDROCODONE-ACETAMINOPHEN 5-325 MG PO TABS
1.0000 | ORAL_TABLET | Freq: Once | ORAL | Status: AC
  Administered 2012-02-08: 1 via ORAL
  Filled 2012-02-08: qty 1

## 2012-02-08 MED ORDER — CEPHALEXIN 500 MG PO CAPS: 500.0000 mg | ORAL_CAPSULE | Freq: Four times a day (QID) | ORAL | Status: DC

## 2012-02-08 NOTE — ED Provider Notes (Signed)
History     CSN: [id number]  Arrival date & time 02/08/12  5621   First MD Initiated Contact with Patient 02/08/12 [phone]      Chief Complaint  Patient presents with  . Leg Pain    (Consider location/radiation/quality/duration/timing/severity/associated sxs/prior treatment) HPI Comments: Pt had L ankle trauma with ORIF by dr. Case at [facility] orthopedics on 11-03-11.  Bones have reportedly healed well but he has had an area above the lateral malleolus that is still open and continues to drain.  Reports having been on rounds of amoxicillin and then keflex by dr. Case.  This is his second visit to the ED and was given IV abx on his last visit.  States he wants a referral to another orthopedist.  Reports low grade fevers.  No other complaints.  Patient is a 61 y.o. male presenting with leg pain. The history is provided by the patient. No language interpreter was used.  Leg Pain  The pain is present in the left ankle. The pain is mild. The pain has been constant since onset.    Past Medical History  Diagnosis Date  . Hypertension   . Herniated disc   . Hepatitis C   . Hepatitis C   . COPD (chronic obstructive pulmonary disease)     Past Surgical History  Procedure Date  . Knee surgery   . Total hip arthroplasty   . Tonsillectomy   . Joint replacement   . Ankle surgery     Family History  Problem Relation Age of Onset  . Cancer Mother     History  Substance Use Topics  . Smoking status: Current Every Day Smoker -- 30 years  . Smokeless tobacco: Former Neurosurgeon    Types: Chew  . Alcohol Use: No     occassional      Review of Systems  Constitutional: Positive for fever. Negative for chills.  Musculoskeletal:       Ankle wound  Skin: Positive for wound.  All other systems reviewed and are negative.    Allergies  Ketorolac tromethamine  Home Medications   Current Outpatient Rx  Name Route Sig Dispense Refill  . AMLODIPINE BESYLATE 10 MG PO TABS Oral Take 10 mg  by mouth daily.     . ASPIRIN EC 81 MG PO TBEC Oral Take 81 mg by mouth daily.    Marland Kitchen GABAPENTIN 300 MG PO CAPS Oral Take 600 mg by mouth daily as needed. For nerve pain    . ADULT MULTIVITAMIN W/MINERALS CH Oral Take 1 tablet by mouth daily.    Marland Kitchen NAPROXEN SODIUM 220 MG PO TABS Oral Take 220 mg by mouth daily as needed. For pain    . NICOTINE 7 MG/24HR TD PT24 Transdermal Place 1 patch onto the skin daily.    Marland Kitchen TEMAZEPAM 7.5 MG PO CAPS Oral Take 7.5 mg by mouth at bedtime as needed. For sleep    . CEPHALEXIN 500 MG PO CAPS Oral Take 1 capsule (500 mg total) by mouth 4 (four) times daily. 40 capsule 0  . HYDROCODONE-ACETAMINOPHEN 5-325 MG PO TABS Oral Take 1 tablet by mouth every 6 (six) hours as needed for pain. 20 tablet 0    BP 165/103  Pulse 103  Temp 98.3 F (36.8 C) (Oral)  Resp 15  Ht 5\' 6"  (1.676 m)  Wt 165 lb (74.844 kg)  BMI 26.63 kg/m2  SpO2 100%  Physical Exam  Nursing note and vitals reviewed. Constitutional: He is oriented to person,  place, and time. He appears well-developed and well-nourished.  HENT:  Head: Normocephalic and atraumatic.  Eyes: EOM are normal.  Neck: Normal range of motion.  Cardiovascular: Normal rate, regular rhythm and intact distal pulses.   Pulmonary/Chest: Effort normal. No respiratory distress.  Abdominal: Soft. He exhibits no distension. There is no tenderness.  Musculoskeletal: He exhibits tenderness.       Left ankle: He exhibits decreased range of motion and swelling. tenderness. Lateral malleolus tenderness found.       Feet:  Neurological: He is alert and oriented to person, place, and time.  Skin: Skin is warm and dry.  Psychiatric: He has a normal mood and affect. Judgment normal.    ED Course  Procedures (including critical care time)  Labs Reviewed - No data to display Dg Ankle Complete Left  02/08/2012  *RADIOLOGY REPORT*  Clinical Data: Open wound over the lateral malleolus with pain and swelling.  Surgery 3 months ago.   LEFT ANKLE COMPLETE - 3+ VIEW  Comparison: 01/04/2012 and prior radiographs  Findings: Internal plate screw fixation of the distal fibula and two screws traversing a healing medial malleolar fracture are noted. There is lucency along the superior aspect of the fibular plate and upper most screw - suspicious for infection or possibly loosening. There is no evidence of acute fracture, subluxation or dislocation. Tibiotalar joint degenerative changes are present.  IMPRESSION: Lucency along the superior aspect of the fibular plate and upper most screw worrisome for infection or possibly loosening.   Original Report Authenticated By: Rosendo Gros, M.D.      1. Left ankle pain   2. Skin infection       MDM  rx-keflex 500 mg QID, 28 rx-hydrocodone, 20 F/u with your orthopedist.        Evalina Field, PA 02/08/12 1124

## 2012-02-08 NOTE — ED Notes (Signed)
Pt c/o lower left leg wound that has been there since July 28th. Pt stated he has been treated multiple times for same wound. Pt wants referral to different to orthopedic surgeon.

## 2012-02-08 NOTE — ED Provider Notes (Signed)
Medical screening examination/treatment/procedure(s) were performed by non-physician practitioner and as supervising physician I was immediately available for consultation/collaboration.  John-Adam Cyruss Arata, M.D.     John-Adam Saul Dorsi, MD 02/08/12 1243 

## 2012-05-23 ENCOUNTER — Emergency Department (HOSPITAL_COMMUNITY)
Admission: EM | Admit: 2012-05-23 | Discharge: 2012-05-23 | Disposition: A | Discharge status: Home / Self Care | Payer: Non-veteran care | Attending: Emergency Medicine | Admitting: Emergency Medicine

## 2012-05-23 ENCOUNTER — Emergency Department (HOSPITAL_COMMUNITY): Payer: Non-veteran care

## 2012-05-23 ENCOUNTER — Encounter (HOSPITAL_COMMUNITY): Payer: Self-pay

## 2012-05-23 DIAGNOSIS — F17200 Nicotine dependence, unspecified, uncomplicated: Secondary | ICD-10-CM | POA: Insufficient documentation

## 2012-05-23 DIAGNOSIS — M1710 Unilateral primary osteoarthritis, unspecified knee: Secondary | ICD-10-CM

## 2012-05-23 DIAGNOSIS — Z8739 Personal history of other diseases of the musculoskeletal system and connective tissue: Secondary | ICD-10-CM | POA: Insufficient documentation

## 2012-05-23 DIAGNOSIS — Z8619 Personal history of other infectious and parasitic diseases: Secondary | ICD-10-CM | POA: Insufficient documentation

## 2012-05-23 DIAGNOSIS — J4489 Other specified chronic obstructive pulmonary disease: Secondary | ICD-10-CM | POA: Insufficient documentation

## 2012-05-23 DIAGNOSIS — Z79899 Other long term (current) drug therapy: Secondary | ICD-10-CM | POA: Insufficient documentation

## 2012-05-23 DIAGNOSIS — I10 Essential (primary) hypertension: Secondary | ICD-10-CM | POA: Insufficient documentation

## 2012-05-23 DIAGNOSIS — J449 Chronic obstructive pulmonary disease, unspecified: Secondary | ICD-10-CM | POA: Insufficient documentation

## 2012-05-23 DIAGNOSIS — IMO0002 Reserved for concepts with insufficient information to code with codable children: Secondary | ICD-10-CM | POA: Insufficient documentation

## 2012-05-23 DIAGNOSIS — M25569 Pain in unspecified knee: Secondary | ICD-10-CM | POA: Insufficient documentation

## 2012-05-23 DIAGNOSIS — M25562 Pain in left knee: Secondary | ICD-10-CM

## 2012-05-23 DIAGNOSIS — Z7982 Long term (current) use of aspirin: Secondary | ICD-10-CM | POA: Insufficient documentation

## 2012-05-23 DIAGNOSIS — G8929 Other chronic pain: Secondary | ICD-10-CM | POA: Insufficient documentation

## 2012-05-23 HISTORY — DX: Other chronic pain: G89.29

## 2012-05-23 HISTORY — DX: Pain in left knee: M25.562

## 2012-05-23 MED ORDER — OXYCODONE-ACETAMINOPHEN 5-325 MG PO TABS: ORAL_TABLET | ORAL | Status: DC

## 2012-05-23 NOTE — ED Notes (Signed)
Pt injured left knee yesterday while moving hay

## 2012-05-23 NOTE — ED Provider Notes (Signed)
History     CSN: [id number]  Arrival date & time 05/23/12  0727   First MD Initiated Contact with Patient 05/23/12 [phone]      Chief Complaint  Patient presents with  . Knee Pain     HPI Pt was seen at 0740.   Per pt, c/o gradual onset and persistence of constant acute flair of his chronic left knee "pain" for the past 2 days.  States the pain began after he "jumped down off the hay wagon." Describes the pain as per his usual chronic pain pattern for many years.  States he was told by his Ortho MD that he "needs a total knee replacement."  Denies direct injury, no fever, no rash, no focal motor weakness, no tingling/numbness in extremities, no back pain.     Past Medical History  Diagnosis Date  . Hypertension   . Herniated disc   . Hepatitis C   . COPD (chronic obstructive pulmonary disease)   . Chronic pain   . Chronic pain of left knee     Past Surgical History  Procedure Laterality Date  . Knee surgery    . Total hip arthroplasty    . Tonsillectomy    . Joint replacement    . Ankle surgery      Family History  Problem Relation Age of Onset  . Cancer Mother     History  Substance Use Topics  . Smoking status: Current Every Day Smoker -- 30 years  . Smokeless tobacco: Former Neurosurgeon    Types: Chew  . Alcohol Use: No     Comment: occassional     Review of Systems ROS: Statement: All systems negative except as marked or noted in the HPI; Constitutional: Negative for fever and chills. ; ; Eyes: Negative for eye pain, redness and discharge. ; ; ENMT: Negative for ear pain, hoarseness, nasal congestion, sinus pressure and sore throat. ; ; Cardiovascular: Negative for chest pain, palpitations, diaphoresis, dyspnea and peripheral edema. ; ; Respiratory: Negative for cough, wheezing and stridor. ; ; Gastrointestinal: Negative for nausea, vomiting, diarrhea, abdominal pain, blood in stool, hematemesis, jaundice and rectal bleeding. . ; ; Genitourinary: Negative for dysuria,  flank pain and hematuria. ; ; Musculoskeletal: +left knee pain. Negative for back pain and neck pain. Negative for swelling and deformity.; ; Skin: Negative for pruritus, rash, abrasions, blisters, bruising and skin lesion.; ; Neuro: Negative for headache, lightheadedness and neck stiffness. Negative for weakness, altered level of consciousness , altered mental status, extremity weakness, paresthesias, involuntary movement, seizure and syncope.     Allergies  Ketorolac tromethamine  Home Medications   Current Outpatient Rx  Name  Route  Sig  Dispense  Refill  . amLODipine (NORVASC) 10 MG tablet   Oral   Take 10 mg by mouth daily.          Marland Kitchen aspirin EC 81 MG tablet   Oral   Take 81 mg by mouth daily.         Marland Kitchen gabapentin (NEURONTIN) 300 MG capsule   Oral   Take 600 mg by mouth daily as needed. For nerve pain         . Multiple Vitamin (MULTIVITAMIN WITH MINERALS) TABS   Oral   Take 1 tablet by mouth daily.         . naproxen sodium (ALEVE) 220 MG tablet   Oral   Take 220 mg by mouth daily as needed. For pain         .  nicotine (NICODERM CQ - DOSED IN MG/24 HR) 7 mg/24hr patch   Transdermal   Place 1 patch onto the skin daily.         . temazepam (RESTORIL) 7.5 MG capsule   Oral   Take 7.5 mg by mouth at bedtime as needed. For sleep           BP 146/94  Pulse 73  Temp(Src) 98.1 F (36.7 C) (Oral)  Resp 18  Ht 5\' 6"  (1.676 m)  Wt 165 lb (74.844 kg)  BMI 26.64 kg/m2  SpO2 97%  Physical Exam 0745: Physical examination:  Nursing notes reviewed; Vital signs and O2 SAT reviewed;  Constitutional: Well developed, Well nourished, Well hydrated, In no acute distress; Head:  Normocephalic, atraumatic; Eyes: EOMI, PERRL, No scleral icterus; ENMT: Mouth and pharynx normal, Mucous membranes moist; Neck: Supple, Full range of motion, No lymphadenopathy; Cardiovascular: Regular rate and rhythm, No murmur, rub, or gallop; Respiratory: Breath sounds clear & equal  bilaterally, No rales, rhonchi, wheezes.  Speaking full sentences with ease, Normal respiratory effort/excursion; Chest: Nontender, Movement normal; Extremities: Pulses normal, +FROM left knee, including able to lift extended LLE off stretcher, and extend left lower leg against resistance.  No ligamentous laxity.  No patellar or quad tendon step-offs.  NMS intact left foot, strong pedal pp. +plantarflexion of left foot w/calf squeeze.  No palpable gap left Achilles's tendon.  No proximal fibular head tenderness.  No edema, erythema, warmth, ecchymosis or deformity.  +patellar and medial joint line areas TTP.  NT left hip/ankle/foot. No calf edema or asymmetry.; Neuro: AA&Ox3, Major CN grossly intact.  Speech clear. Gait steady with cane. No gross focal motor or sensory deficits in extremities.; Skin: Color normal, Warm, Dry.   ED Course  Procedures    MDM  MDM Reviewed: nursing note, previous chart and vitals Reviewed previous: x-ray Interpretation: x-ray     Dg Knee Complete 4 Views Left 05/23/2012  *RADIOLOGY REPORT*  Clinical Data: Twisted left knee  LEFT KNEE - COMPLETE 4+ VIEW  Comparison: Plain film 06/22/2011  Findings: There is no evidence of fracture or dislocation left knee.  There is joint space narrowing of the medial compartment. The patella is essentially absent with a small round calcifications within the patellofemoral ligament.  IMPRESSION:  1.  No acute fracture dislocation. 2.  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 3.  Essentially absent patella. Query prior surgery.   Original Report Authenticated By: Genevive Bi, M.D.      [phone]:  Pt has a cane, will apply knee immobilizer, f/u with his Ortho MD this week.  Dx and testing d/w pt.  Questions answered.  Verb understanding, agreeable to d/c home with outpt f/u.     Laray Anger, DO 05/25/12 1740

## 2012-10-03 ENCOUNTER — Emergency Department (HOSPITAL_COMMUNITY): Payer: Non-veteran care

## 2012-10-03 ENCOUNTER — Inpatient Hospital Stay (HOSPITAL_COMMUNITY)
Admission: EM | Admit: 2012-10-03 | Discharge: 2012-10-05 | DRG: 190 | Disposition: A | Discharge status: Home / Self Care | Payer: Non-veteran care | Attending: Internal Medicine | Admitting: Internal Medicine

## 2012-10-03 ENCOUNTER — Encounter (HOSPITAL_COMMUNITY): Payer: Self-pay | Admitting: Emergency Medicine

## 2012-10-03 DIAGNOSIS — Z96649 Presence of unspecified artificial hip joint: Secondary | ICD-10-CM

## 2012-10-03 DIAGNOSIS — B182 Chronic viral hepatitis C: Secondary | ICD-10-CM | POA: Diagnosis present

## 2012-10-03 DIAGNOSIS — D6959 Other secondary thrombocytopenia: Secondary | ICD-10-CM

## 2012-10-03 DIAGNOSIS — Z87891 Personal history of nicotine dependence: Secondary | ICD-10-CM

## 2012-10-03 DIAGNOSIS — J189 Pneumonia, unspecified organism: Secondary | ICD-10-CM

## 2012-10-03 DIAGNOSIS — I10 Essential (primary) hypertension: Secondary | ICD-10-CM | POA: Diagnosis present

## 2012-10-03 DIAGNOSIS — J441 Chronic obstructive pulmonary disease with (acute) exacerbation: Principal | ICD-10-CM

## 2012-10-03 DIAGNOSIS — R7309 Other abnormal glucose: Secondary | ICD-10-CM | POA: Diagnosis present

## 2012-10-03 DIAGNOSIS — R55 Syncope and collapse: Secondary | ICD-10-CM

## 2012-10-03 DIAGNOSIS — B1920 Unspecified viral hepatitis C without hepatic coma: Secondary | ICD-10-CM

## 2012-10-03 DIAGNOSIS — R0902 Hypoxemia: Secondary | ICD-10-CM | POA: Diagnosis present

## 2012-10-03 DIAGNOSIS — G8929 Other chronic pain: Secondary | ICD-10-CM | POA: Diagnosis present

## 2012-10-03 DIAGNOSIS — IMO0002 Reserved for concepts with insufficient information to code with codable children: Secondary | ICD-10-CM

## 2012-10-03 DIAGNOSIS — K7460 Unspecified cirrhosis of liver: Secondary | ICD-10-CM

## 2012-10-03 DIAGNOSIS — T380X5A Adverse effect of glucocorticoids and synthetic analogues, initial encounter: Secondary | ICD-10-CM | POA: Diagnosis present

## 2012-10-03 DIAGNOSIS — D72829 Elevated white blood cell count, unspecified: Secondary | ICD-10-CM

## 2012-10-03 DIAGNOSIS — J449 Chronic obstructive pulmonary disease, unspecified: Secondary | ICD-10-CM

## 2012-10-03 LAB — CBC
HCT: 41.1 % (ref 39.0–52.0)
Hemoglobin: 14.4 g/dL (ref 13.0–17.0)
MCH: 34 pg (ref 26.0–34.0)
MCV: 97.2 fL (ref 78.0–100.0)
RBC: 4.23 MIL/uL (ref 4.22–5.81)

## 2012-10-03 LAB — HEPATIC FUNCTION PANEL
ALT: 42 U/L (ref 0–53)
AST: 81 U/L — ABNORMAL HIGH (ref 0–37)
Albumin: 2.7 g/dL — ABNORMAL LOW (ref 3.5–5.2)

## 2012-10-03 LAB — BASIC METABOLIC PANEL
BUN: 4 mg/dL — ABNORMAL LOW (ref 6–23)
CO2: 25 mEq/L (ref 19–32)
Calcium: 8.9 mg/dL (ref 8.4–10.5)
Glucose, Bld: 188 mg/dL — ABNORMAL HIGH (ref 70–99)
Sodium: 137 mEq/L (ref 135–145)

## 2012-10-03 MED ORDER — ALBUTEROL SULFATE (5 MG/ML) 0.5% IN NEBU
INHALATION_SOLUTION | RESPIRATORY_TRACT | Status: AC
  Administered 2012-10-03: 5 mg
  Filled 2012-10-03: qty 1

## 2012-10-03 MED ORDER — NICOTINE 7 MG/24HR TD PT24
7.0000 mg | MEDICATED_PATCH | TRANSDERMAL | Status: DC
  Administered 2012-10-04 (×2): 7 mg via TRANSDERMAL
  Filled 2012-10-03 (×3): qty 1

## 2012-10-03 MED ORDER — IPRATROPIUM BROMIDE 0.02 % IN SOLN
0.5000 mg | Freq: Once | RESPIRATORY_TRACT | Status: AC
  Administered 2012-10-03: 0.5 mg via RESPIRATORY_TRACT
  Filled 2012-10-03: qty 2.5

## 2012-10-03 MED ORDER — DEXTROSE 5 % IV SOLN
1.0000 g | INTRAVENOUS | Status: DC
  Administered 2012-10-04: 1 g via INTRAVENOUS
  Filled 2012-10-03 (×2): qty 10

## 2012-10-03 MED ORDER — ASPIRIN EC 81 MG PO TBEC
81.0000 mg | DELAYED_RELEASE_TABLET | Freq: Every day | ORAL | Status: DC
  Administered 2012-10-04 – 2012-10-05 (×2): 81 mg via ORAL
  Filled 2012-10-03 (×2): qty 1

## 2012-10-03 MED ORDER — ONDANSETRON HCL 4 MG PO TABS: 4.0000 mg | ORAL_TABLET | Freq: Four times a day (QID) | ORAL | Status: DC | PRN

## 2012-10-03 MED ORDER — SODIUM CHLORIDE 0.9 % IV SOLN: INTRAVENOUS | Status: AC

## 2012-10-03 MED ORDER — ACETAMINOPHEN 650 MG RE SUPP: 650.0000 mg | Freq: Four times a day (QID) | RECTAL | Status: DC | PRN

## 2012-10-03 MED ORDER — METHYLPREDNISOLONE SODIUM SUCC 125 MG IJ SOLR
125.0000 mg | Freq: Once | INTRAMUSCULAR | Status: AC
  Administered 2012-10-03: 125 mg via INTRAVENOUS
  Filled 2012-10-03: qty 2

## 2012-10-03 MED ORDER — HYDROCOD POLST-CHLORPHEN POLST 10-8 MG/5ML PO LQCR
5.0000 mL | Freq: Once | ORAL | Status: AC
  Administered 2012-10-03: 5 mL via ORAL
  Filled 2012-10-03: qty 5

## 2012-10-03 MED ORDER — METHYLPREDNISOLONE SODIUM SUCC 125 MG IJ SOLR
80.0000 mg | Freq: Four times a day (QID) | INTRAMUSCULAR | Status: DC
  Administered 2012-10-04 – 2012-10-05 (×6): 80 mg via INTRAVENOUS
  Filled 2012-10-03 (×6): qty 2

## 2012-10-03 MED ORDER — DEXTROSE 5 % IV SOLN
500.0000 mg | Freq: Once | INTRAVENOUS | Status: AC
  Administered 2012-10-03: 500 mg via INTRAVENOUS
  Filled 2012-10-03: qty 500

## 2012-10-03 MED ORDER — TEMAZEPAM 7.5 MG PO CAPS
7.5000 mg | ORAL_CAPSULE | Freq: Every evening | ORAL | Status: DC | PRN
  Administered 2012-10-04: 7.5 mg via ORAL
  Filled 2012-10-03: qty 1

## 2012-10-03 MED ORDER — DEXTROSE 5 % IV SOLN
1.0000 g | Freq: Once | INTRAVENOUS | Status: AC
  Administered 2012-10-03: 1 g via INTRAVENOUS
  Filled 2012-10-03: qty 10

## 2012-10-03 MED ORDER — LEVALBUTEROL HCL 0.63 MG/3ML IN NEBU: 0.6300 mg | INHALATION_SOLUTION | Freq: Four times a day (QID) | RESPIRATORY_TRACT | Status: DC | PRN

## 2012-10-03 MED ORDER — ONDANSETRON HCL 4 MG/2ML IJ SOLN: 4.0000 mg | Freq: Four times a day (QID) | INTRAMUSCULAR | Status: DC | PRN

## 2012-10-03 MED ORDER — ALBUTEROL SULFATE (5 MG/ML) 0.5% IN NEBU
5.0000 mg | INHALATION_SOLUTION | Freq: Once | RESPIRATORY_TRACT | Status: AC
  Administered 2012-10-03: 5 mg via RESPIRATORY_TRACT
  Filled 2012-10-03: qty 1

## 2012-10-03 MED ORDER — DEXTROSE 5 % IV SOLN
500.0000 mg | INTRAVENOUS | Status: DC
  Administered 2012-10-04: 500 mg via INTRAVENOUS
  Filled 2012-10-03 (×2): qty 500

## 2012-10-03 MED ORDER — ACETAMINOPHEN 325 MG PO TABS: 650.0000 mg | ORAL_TABLET | Freq: Four times a day (QID) | ORAL | Status: DC | PRN

## 2012-10-03 MED ORDER — IPRATROPIUM BROMIDE 0.02 % IN SOLN
RESPIRATORY_TRACT | Status: AC
  Administered 2012-10-03: 0.5 mg
  Filled 2012-10-03: qty 2.5

## 2012-10-03 MED ORDER — SODIUM CHLORIDE 0.9 % IJ SOLN
3.0000 mL | Freq: Two times a day (BID) | INTRAMUSCULAR | Status: DC
  Administered 2012-10-04 (×2): 3 mL via INTRAVENOUS

## 2012-10-03 MED ORDER — AMLODIPINE BESYLATE 5 MG PO TABS
10.0000 mg | ORAL_TABLET | Freq: Every day | ORAL | Status: DC
  Administered 2012-10-04 – 2012-10-05 (×2): 10 mg via ORAL
  Filled 2012-10-03 (×2): qty 2

## 2012-10-03 MED ORDER — LEVALBUTEROL HCL 0.63 MG/3ML IN NEBU
0.6300 mg | INHALATION_SOLUTION | Freq: Four times a day (QID) | RESPIRATORY_TRACT | Status: DC
  Administered 2012-10-04 – 2012-10-05 (×5): 0.63 mg via RESPIRATORY_TRACT
  Filled 2012-10-03 (×5): qty 3

## 2012-10-03 NOTE — ED Provider Notes (Signed)
History     CSN: [id number]  Arrival date & time 10/03/12  4098   First MD Initiated Contact with Patient 10/03/12 1831      Chief Complaint  Patient presents with  . Shortness of Breath    (Consider location/radiation/quality/duration/timing/severity/associated sxs/prior treatment) HPI..... patient with long-standing COPD presents with wheezing and coughing after mowing grass today. He has had a productive cough for approximately one month.  He smokes cigarettes and drinks alcohol.  Nothing makes symptoms better or worse. Severity is moderate. No chest pain, fever, chills.  Past Medical History  Diagnosis Date  . Hypertension   . Herniated disc   . Hepatitis C   . COPD (chronic obstructive pulmonary disease)   . Chronic pain   . Chronic pain of left knee     Past Surgical History  Procedure Laterality Date  . Knee surgery    . Total hip arthroplasty    . Tonsillectomy    . Joint replacement    . Ankle surgery      Family History  Problem Relation Age of Onset  . Cancer Mother     History  Substance Use Topics  . Smoking status: Former Smoker -- 30 years    Quit date: 09/02/2012  . Smokeless tobacco: Former Neurosurgeon    Types: Chew  . Alcohol Use: Yes     Comment: occassional      Review of Systems  All other systems reviewed and are negative.    Allergies  Ketorolac tromethamine  Home Medications   Current Outpatient Rx  Name  Route  Sig  Dispense  Refill  . amLODipine (NORVASC) 10 MG tablet   Oral   Take 10 mg by mouth daily.          Marland Kitchen aspirin EC 81 MG tablet   Oral   Take 81 mg by mouth daily.         Marland Kitchen gabapentin (NEURONTIN) 300 MG capsule   Oral   Take 600 mg by mouth daily as needed. For nerve pain         . Multiple Vitamin (MULTIVITAMIN WITH MINERALS) TABS   Oral   Take 1 tablet by mouth daily.         . naproxen sodium (ALEVE) 220 MG tablet   Oral   Take 220 mg by mouth daily as needed. For pain         . nicotine  (NICODERM CQ - DOSED IN MG/24 HR) 7 mg/24hr patch   Transdermal   Place 1 patch onto the skin daily.         Marland Kitchen oxyCODONE-acetaminophen (PERCOCET/ROXICET) 5-325 MG per tablet      1 or 2 tabs PO q6h prn pain   20 tablet   0   . temazepam (RESTORIL) 7.5 MG capsule   Oral   Take 7.5 mg by mouth at bedtime as needed. For sleep           BP 142/77  Pulse 107  Temp(Src) 98.4 F (36.9 C)  Resp 20  Ht 5\' 6"  (1.676 m)  Wt 162 lb (73.483 kg)  BMI 26.16 kg/m2  SpO2 93%  Physical Exam  Nursing note and vitals reviewed. Constitutional: He is oriented to person, place, and time. He appears well-developed and well-nourished.  HENT:  Head: Normocephalic and atraumatic.  Eyes: Conjunctivae and EOM are normal. Pupils are equal, round, and reactive to light.  Neck: Normal range of motion. Neck supple.  Cardiovascular: Normal rate,  regular rhythm and normal heart sounds.   Pulmonary/Chest: Effort normal.  Bilateral expiratory wheezes  Abdominal: Soft. Bowel sounds are normal.  Musculoskeletal: Normal range of motion.  Neurological: He is alert and oriented to person, place, and time.  Skin: Skin is warm and dry.  Psychiatric: He has a normal mood and affect.    ED Course  Procedures (including critical care time)  Labs Reviewed  CBC - Abnormal; Notable for the following:    Platelets 98 (*)    All other components within normal limits  BASIC METABOLIC PANEL - Abnormal; Notable for the following:    Glucose, Bld 188 (*)    BUN 4 (*)    All other components within normal limits    Dg Chest 2 View  10/03/2012   *RADIOLOGY REPORT*  Clinical Data: Short of breath  CHEST - 2 VIEW  Comparison: None.  Findings: Bibasilar airspace disease may represent pneumonia or atelectasis.  Negative for heart failure or effusion.  Negative for mass lesion.  IMPRESSION: Bibasilar atelectasis or pneumonia.   Original Report Authenticated By: Janeece Riggers, M.D.   No results found.   No diagnosis  found.    MDM  Patient is having a COPD flare.   Albuterol/Atrovent. IV Solu-Medrol. Chest x-ray reveals bibasilar pneumonia.  IV Rocephin, IV Zithromax.   Admit to general medicine        Donnetta Hutching, MD 10/03/12 2226

## 2012-10-03 NOTE — ED Notes (Signed)
Pt c/o productive cough-green sputum x 1 month. Pt states he was out in yard mowing grass today and became increasingly sob. O2 sats 92% upon ems arrival, 98% after duoneb treatment. Nad noted.

## 2012-10-03 NOTE — ED Notes (Signed)
Pt reporting minimal improvement following breathing treatment.  Reports continued cough.  Reporting that he takes albuterol to treat COPD and hasn't been able to get it from the [place].  Pt wondering if he'll be admitted.  Explained to pt that the physician won't make that determination till he's gotten radiology and/or test results.

## 2012-10-03 NOTE — H&P (Addendum)
[facility] History and Physical  Victor Davidson NFA:[id number] DOB: [birth year] DOA: 10/03/2012   [facility] [place] [facility]: Patient is followed by gastroenterologist and orthopedic surgeon. He has an appointment to see pulmonologist on June 15.  Chief Complaint: Shortness of breath for 3 weeks  HPI: Victor Davidson is a 62 y.o. male with a past medical history of COPD, hepatitis C, liver cirrhosis, who was in his usual state of health about 3 weeks ago, when he started developing shortness of breath. He had a fever up to 102F. He had a cough with greenish expectoration. He took some over-the-counter medications and had the ciprofloxacin leftover from before. He started feeling some better. However, last Wednesday, he felt that he wasn't improving. Continued to have a cough and continued wheezing. And, then today he was mowing his lawn when he felt really dizzy, started having a cough, felt more short of breath, and then proceeded to pass out. He must have been on the ground for about 3 minutes. Denies any head injuries. He has been having left-sided dull chest pain for a few days. Has also noticed epistaxis occasionally, but that has stopped. Denies any nausea, vomiting. He does not have any inhalers at home. He's run out of his medications. Denies any leg swelling. Has a history of MRSA in his left ankle last year. Also admits that his breathing has been getting worse progressively over the last couple years.  Home Medications: Prior to Admission medications   Medication Sig Start Date End Date Taking? Authorizing Provider  amLODipine (NORVASC) 10 MG tablet Take 10 mg by mouth daily.    Yes Historical Provider, MD  aspirin EC 81 MG tablet Take 81 mg by mouth daily.   Yes Historical Provider, MD  Multiple Vitamin (MULTIVITAMIN WITH MINERALS) TABS Take 1 tablet by mouth daily.   Yes Historical Provider, MD  naproxen sodium (ALEVE) 220 MG tablet Take 220 mg by mouth daily as needed. For  pain   Yes Historical Provider, MD  nicotine (NICODERM CQ - DOSED IN MG/24 HR) 7 mg/24hr patch Place 1 patch onto the skin daily.   Yes Historical Provider, MD  temazepam (RESTORIL) 7.5 MG capsule Take 7.5 mg by mouth at bedtime as needed. For sleep   Yes Historical Provider, MD  gabapentin (NEURONTIN) 300 MG capsule Take 600 mg by mouth daily as needed. For nerve pain    Historical Provider, MD    Allergies:  Allergies  Allergen Reactions  . Ketorolac Tromethamine     Burning at injection site x 3 days.     Past Medical History: Past Medical History  Diagnosis Date  . Hypertension   . Herniated disc   . Hepatitis C   . COPD (chronic obstructive pulmonary disease)   . Chronic pain   . Chronic pain of left knee     Past Surgical History  Procedure Laterality Date  . Knee surgery    . Total hip arthroplasty    . Tonsillectomy    . Joint replacement    . Ankle surgery      Social History:  reports that he quit smoking about 4 weeks ago. He has quit using smokeless tobacco. His smokeless tobacco use included Chew. He reports that  drinks alcohol. He reports that he does not use illicit drugs.  Living Situation: He lives with his wife Activity Level: Independent with his daily activities   Family History:  Family History  Problem Relation Age of Onset  .  Cancer Mother      Review of Systems - History obtained from the patient General ROS: positive for  - fatigue Psychological ROS: negative Ophthalmic ROS: negative ENT ROS: negative Allergy and Immunology ROS: negative Hematological and Lymphatic ROS: negative Endocrine ROS: negative Respiratory ROS: as in hpi Cardiovascular ROS: as in hpi Gastrointestinal ROS: no abdominal pain, change in bowel habits, or black or bloody stools Genito-Urinary ROS: no dysuria, trouble voiding, or hematuria Musculoskeletal ROS: negative Neurological ROS: no TIA or stroke symptoms Dermatological ROS: negative  Physical  Examination  Filed Vitals:   10/03/12 1915 10/03/12 2036 10/03/12 2241 10/03/12 2304  BP:   147/78   Pulse:   117   Temp:    98 F (36.7 C)  TempSrc:    Oral  Resp:   24   Height:      Weight:      SpO2: 94% 94% 96%     General appearance: alert, cooperative, appears stated age and no distress Head: Normocephalic, without obvious abnormality, atraumatic Eyes: conjunctivae/corneas clear. PERRL, EOM's intact Throat: lips, mucosa, and tongue normal; teeth and gums normal Neck: no adenopathy, no carotid bruit, no JVD, supple, symmetrical, trachea midline and thyroid not enlarged, symmetric, no tenderness/mass/nodules Resp: He has end expiratory wheezing bilaterally. No crackles. Cardio: S1, S2 is tachycardic. Regular. No S3 S4s. No rubs, murmurs, or bruits. GI: soft, non-tender; bowel sounds normal; no masses,  no organomegaly Extremities: extremities normal, atraumatic, no cyanosis or edema Pulses: 2+ and symmetric Skin: Skin color, texture, turgor normal. No rashes or lesions Lymph nodes: Cervical, supraclavicular, and axillary nodes normal. Neurologic: Alert and oriented X 3, normal strength and tone. Normal symmetric reflexes. No cranial nerve deficits  Laboratory Data: Results for orders placed during the hospital encounter of 10/03/12 (from the past 48 hour(s))  CBC     Status: Abnormal   Collection Time    10/03/12  9:01 PM      Result Value Range   WBC 5.0  4.0 - 10.5 K/uL   RBC 4.23  4.22 - 5.81 MIL/uL   Hemoglobin 14.4  13.0 - 17.0 g/dL   HCT 04.5  40.9 - 81.1 %   MCV 97.2  78.0 - 100.0 fL   MCH 34.0  26.0 - 34.0 pg   MCHC 35.0  30.0 - 36.0 g/dL   RDW 91.4  78.2 - 95.6 %   Platelets 98 (*) 150 - 400 K/uL  BASIC METABOLIC PANEL     Status: Abnormal   Collection Time    10/03/12  9:01 PM      Result Value Range   Sodium 137  135 - 145 mEq/L   Potassium 3.9  3.5 - 5.1 mEq/L   Chloride 101  96 - 112 mEq/L   CO2 25  19 - 32 mEq/L   Glucose, Bld 188 (*) 70 - 99  mg/dL   BUN 4 (*) 6 - 23 mg/dL   Creatinine, Ser 2.13  0.50 - 1.35 mg/dL   Calcium 8.9  8.4 - 08.6 mg/dL   GFR calc non Af Amer >90  >90 mL/min   GFR calc Af Amer >90  >90 mL/min   Comment:            The eGFR has been calculated     using the CKD EPI equation.     This calculation has not been     validated in all clinical     situations.     eGFR's persistently     <  90 mL/min signify     possible Chronic Kidney Disease.    Radiology Reports: Dg Chest 2 View  10/03/2012   *RADIOLOGY REPORT*  Clinical Data: Short of breath  CHEST - 2 VIEW  Comparison: None.  Findings: Bibasilar airspace disease may represent pneumonia or atelectasis.  Negative for heart failure or effusion.  Negative for mass lesion.  IMPRESSION: Bibasilar atelectasis or pneumonia.   Original Report Authenticated By: Janeece Riggers, M.D.    Electrocardiogram: EKG is pending  Problem List  Principal Problem:   COPD with acute exacerbation Active Problems:   Syncope   Hepatitis C   Community acquired pneumonia   Liver cirrhosis   Thrombocytopenia, secondary   Assessment: This is a 62 year old, Caucasian male who presents with 3 week history of shortness of breath followed by a syncopal episode earlier today. I believe he has COPD exacerbation with pneumonia on chest x-ray. This will be considered Community-acquired pneumonia. The syncopal episode is concerning. It could be related to his dyspnea. He is tachycardic and thromboembolism cannot be entirely ruled out.  Plan: #1 syncope: Most likely due to his respiratory symptoms. We will get EKG and Troponin. Get a d-dimer. He does not have any focal neurological deficits. But he is thrombocytopenic. He might also be coagulopathic due to his liver cirrhosis. We will consider getting a CT head.  #2 acute exacerbation of COPD: Give nebulizer treatments. Give steroids. Continue with antibiotics.  #3 community-acquired pneumonia: Continue with IV antibiotics for  now.  #4 history of hepatitis C with liver cirrhosis and thrombocytopenia: Continue to monitor. Check LFTs. Check a PT/INR  #5 hyperglycemia: Will get a fasting level in the morning and further evaluation will depend on that value.   DVT Prophylaxis: Platelet counts are low. SCDs will be prescribed Code Status: Full code Family Communication: No family at bedside  Disposition Plan: Admit to telemetry   Further management decisions will depend on results of further testing and patient's response to treatment.  [facility]  [facility] Pager [phone]  If 7PM-7AM, please contact night-coverage [email or web address] Password [facility]  10/03/2012, 11:22 PM

## 2012-10-04 ENCOUNTER — Inpatient Hospital Stay (HOSPITAL_COMMUNITY): Payer: Non-veteran care

## 2012-10-04 DIAGNOSIS — J449 Chronic obstructive pulmonary disease, unspecified: Secondary | ICD-10-CM

## 2012-10-04 LAB — COMPREHENSIVE METABOLIC PANEL
ALT: 37 U/L (ref 0–53)
AST: 64 U/L — ABNORMAL HIGH (ref 0–37)
Alkaline Phosphatase: 111 U/L (ref 39–117)
CO2: 25 mEq/L (ref 19–32)
Calcium: 8.4 mg/dL (ref 8.4–10.5)
Chloride: 100 mEq/L (ref 96–112)
GFR calc Af Amer: >90 mL/min (ref >90)
GFR calc non Af Amer: >90 mL/min (ref >90)
Glucose, Bld: 234 mg/dL — ABNORMAL HIGH (ref 70–99)
Potassium: 4 mEq/L (ref 3.5–5.1)
Sodium: 133 mEq/L — ABNORMAL LOW (ref 135–145)

## 2012-10-04 LAB — PROTIME-INR: INR: 1.22 (ref 0.00–1.49)

## 2012-10-04 LAB — CBC
HCT: 39 % (ref 39.0–52.0)
Hemoglobin: 13.5 g/dL (ref 13.0–17.0)
MCH: 33.3 pg (ref 26.0–34.0)
MCHC: 34.6 g/dL (ref 30.0–36.0)
RDW: 12.4 % (ref 11.5–15.5)

## 2012-10-04 LAB — TSH: TSH: 0.784 u[IU]/mL (ref 0.350–4.500)

## 2012-10-04 LAB — APTT: aPTT: 36 seconds (ref 24–37)

## 2012-10-04 LAB — HEMOGLOBIN A1C
Hgb A1c MFr Bld: 5.7 % — ABNORMAL HIGH (ref <5.7)
Mean Plasma Glucose: 117 mg/dL — ABNORMAL HIGH (ref <117)

## 2012-10-04 LAB — HIV ANTIBODY (ROUTINE TESTING W REFLEX): HIV: NONREACTIVE

## 2012-10-04 MED ORDER — LORAZEPAM 1 MG PO TABS
1.0000 mg | ORAL_TABLET | Freq: Four times a day (QID) | ORAL | Status: DC | PRN
  Administered 2012-10-04 (×3): 1 mg via ORAL
  Filled 2012-10-04 (×3): qty 1

## 2012-10-04 MED ORDER — IOHEXOL 350 MG/ML SOLN
100.0000 mL | Freq: Once | INTRAVENOUS | Status: AC | PRN
  Administered 2012-10-04: 100 mL via INTRAVENOUS

## 2012-10-04 MED ORDER — NICOTINE 7 MG/24HR TD PT24
MEDICATED_PATCH | TRANSDERMAL | Status: AC
  Filled 2012-10-04: qty 1

## 2012-10-04 MED ORDER — VITAMIN B-1 100 MG PO TABS
100.0000 mg | ORAL_TABLET | Freq: Every day | ORAL | Status: DC
  Administered 2012-10-04 – 2012-10-05 (×2): 100 mg via ORAL
  Filled 2012-10-04 (×2): qty 1

## 2012-10-04 NOTE — Progress Notes (Signed)
ANTIBIOTIC CONSULT NOTE - INITIAL  Pharmacy Consult for renal adjustment of antibiotics Indication: COPD exacerbation/SOB  Allergies  Allergen Reactions  . Ketorolac Tromethamine     Burning at injection site x 3 days.     Patient Measurements: Height: 5\' 6"  (167.6 cm) Weight: 162 lb (73.483 kg) IBW/kg (Calculated) : 63.8  Vital Signs: Temp: 97.7 F (36.5 C) (06/23 0500) Temp src: Oral (06/23 0500) BP: 142/80 mmHg (06/23 0500) Pulse Rate: 94 (06/23 0500) Intake/Output from previous day: 06/22 0701 - 06/23 0700 In: 995 [P.O.:360; I.V.:635] Out: -  Intake/Output from this shift:    Labs:  Recent Labs  10/03/12 2101 10/04/12 0504  WBC 5.0 3.9*  HGB 14.4 13.5  PLT 98* 90*  CREATININE 0.74 0.72   Estimated Creatinine Clearance: 88.6 ml/min (by C-G formula based on Cr of 0.72). No results found for this basename: VANCOTROUGH, VANCOPEAK, VANCORANDOM, GENTTROUGH, GENTPEAK, GENTRANDOM, TOBRATROUGH, TOBRAPEAK, TOBRARND, AMIKACINPEAK, AMIKACINTROU, AMIKACIN,  in the last 72 hours   Microbiology: No results found for this or any previous visit (from the past 720 hour(s)).  Medical History: Past Medical History  Diagnosis Date  . Hypertension   . Herniated disc   . Hepatitis C   . COPD (chronic obstructive pulmonary disease)   . Chronic pain   . Chronic pain of left knee    Medications:  Scheduled:  . sodium chloride   Intravenous STAT  . amLODipine  10 mg Oral Daily  . aspirin EC  81 mg Oral Daily  . azithromycin  500 mg Intravenous Q24H  . cefTRIAXone (ROCEPHIN)  IV  1 g Intravenous Q24H  . levalbuterol  0.63 mg Nebulization Q6H  . methylPREDNISolone (SOLU-MEDROL) injection  80 mg Intravenous Q6H  . nicotine  7 mg Transdermal Q24H  . sodium chloride  3 mL Intravenous Q12H  . thiamine  100 mg Oral Daily   Assessment: 62yo male admitted c/o SOB and empirically started on Zithromax and Rocephin.  No renal adjustment needed.  Goal of Therapy:  Eradicate  infection.  Plan:  Continue current Rx Switch Zithromax to PO when appropriate  Margo Aye, Lynard Postlewait A 10/04/2012,10:11 AM

## 2012-10-04 NOTE — Progress Notes (Signed)
Pt also stated his choking episodes, were occuring more frequently. His last choking episode before tonight occurred about six weeks ago ; from this time he stated he has been sick, coughing etc. Pt also stated that his last surgery anesthesia had a difficult time intubating him.

## 2012-10-04 NOTE — Evaluation (Signed)
Clinical/Bedside Swallow Evaluation Patient Details  Name: RANBIR CHEW MRN: [id number] Date of Birth: Mar 16, 1951  Today's Date: 10/04/2012 Time: 1415-1440 SLP Time Calculation (min): 25 min  Past Medical History:  Past Medical History  Diagnosis Date  . Hypertension   . Herniated disc   . Hepatitis C   . COPD (chronic obstructive pulmonary disease)   . Chronic pain   . Chronic pain of left knee    Past Surgical History:  Past Surgical History  Procedure Laterality Date  . Knee surgery    . Total hip arthroplasty    . Tonsillectomy    . Joint replacement    . Ankle surgery     HPI:  62 year old male admitted 10/03/12 due to SOB.  COPD, PNA.  Pt complains of food sticking, which requires liquid wash or physical movement to alleviate pressure.   Assessment / Plan / Recommendation Clinical Impression  While at risk for pharyngeal dysphagia and silent aspiration due to history of COPD, pt symptoms and presentation appear to be primarily esophageal.  Pt exhibits no overt s/s aspiration and reports no difficulty swallowing, but does verbalize globus sensation, belching, and needing liquid wash or physical movements (pt reported jumping up and down) to alleviate this pressure.  At this point, recommendation is for regular barium swallow to evaluate esophageal motility.  ST to follow up after results and recommendations are available and proceed accordingly.  Pt is at risk for silent aspiration due to COPD, which cannot be detected at bedside.  Pt may also benefit from Modified Barium Swallow to objectively evaluate oropharyngeal swallow function, however, regular barium swallow is recommended to be completed first.    Aspiration Risk  Moderate    Diet Recommendation Regular;Thin liquid   Liquid Administration via: Cup;Straw Medication Administration: Whole meds with liquid Supervision: Patient able to self feed Compensations: Slow rate;Small sips/bites Postural Changes and/or  Swallow Maneuvers: Seated upright 90 degrees;Upright 30-60 min after meal    Other  Recommendations Recommended Consults: Consider esophageal assessment Oral Care Recommendations: Oral care BID   Follow Up Recommendations   (to be determined)    Frequency and Duration min 1 x/week  1 week   Pertinent Vitals/Pain 6/10 burning sensation when coughing    SLP Swallow Goals Patient will consume recommended diet without observed clinical signs of aspiration with: Set-up Swallow Study Goal #1 - Progress: Progressing toward goal Patient will utilize recommended strategies during swallow to increase swallowing safety with: Set-up Swallow Study Goal #2 - Progress: Progressing toward goal   Swallow Study Prior Functional Status   Tolerating regular diet/thin liquids prior to admit. Reports sensation of foods sticking.    General Date of Onset: 10/04/12 HPI: 62 year old male admitted 10/03/12 due to SOB.  COPD, PNA.  Pt complains of food sticking, which requires liquid wash or physical movement to alleviate pressure. Type of Study: Bedside swallow evaluation Diet Prior to this Study: Regular;Thin liquids Temperature Spikes Noted: No Respiratory Status: Room air History of Recent Intubation: No Behavior/Cognition: Alert;Cooperative;Pleasant mood Oral Cavity - Dentition: Missing dentition Self-Feeding Abilities: Able to feed self Patient Positioning: Upright in bed Baseline Vocal Quality: Clear Volitional Cough: Strong Volitional Swallow: Able to elicit    Oral/Motor/Sensory Function Overall Oral Motor/Sensory Function: Appears within functional limits for tasks assessed   Ice Chips Ice chips: Not tested   Thin Liquid Presentation: Straw;Self Fed    Nectar Thick Nectar Thick Liquid: Not tested   Honey Thick Honey Thick Liquid: Not  tested   Puree Puree: Within functional limits Presentation: Self Fed;Spoon   Solid   Wirt Hemmerich B. Leticia Coletta, [name], [name] [phone]    Presentation: Self  Fed Other Comments: Pt indicated pressure, pointing to sternal area.       [name] 10/04/2012,2:53 PM

## 2012-10-04 NOTE — Progress Notes (Addendum)
Patient states he was bit by a spider 4-5 days ago under his right arm.  Patient also got choked on a piece of meat from a [employer] choice meal.  Patient states it has happened with increased frequency as of late.

## 2012-10-04 NOTE — Progress Notes (Signed)
Victor Davidson:[id number] DOB: 1951/03/10 DOA: 10/03/2012 PCP: No PCP Per Patient   Subjective: This veteran was admitted with exacerbation of COPD as well as pneumonia. Syncopal episode is likely related to his dyspnea and there is no evidence of pulmonary embolism or evidence of coronary artery disease/myocardial infarction. He is feeling somewhat better than yesterday.           Physical Exam: Blood pressure 142/80, pulse 94, temperature 97.7 F (36.5 C), temperature source Oral, resp. rate 20, height 5\' 6"  (1.676 m), weight 73.483 kg (162 lb), SpO2 93.00%. He looks systemically well. There is no increased work of breathing. Lung fields show bilateral wheezing. There are no crackles or evidence of bronchial breathing. Heart sounds are present without murmurs heart sounds. He is alert and orientated.        Basic Metabolic Panel:  Recent Labs  95/62/13 2101 10/04/12 0504  NA 137 133*  K 3.9 4.0  CL 101 100  CO2 25 25  GLUCOSE 188* 234*  BUN 4* 5*  CREATININE 0.74 0.72  CALCIUM 8.9 8.4   Liver Function Tests:  Recent Labs  10/03/12 2101 10/04/12 0504  AST 81* 64*  ALT 42 37  ALKPHOS 131* 111  BILITOT 0.5 0.4  PROT 8.0 7.4  ALBUMIN 2.7* 2.4*     CBC:  Recent Labs  10/03/12 2101 10/04/12 0504  WBC 5.0 3.9*  HGB 14.4 13.5  HCT 41.1 39.0  MCV 97.2 96.3  PLT 98* 90*    Dg Chest 2 View  10/03/2012   *RADIOLOGY REPORT*  Clinical Data: Short of breath  CHEST - 2 VIEW  Comparison: None.  Findings: Bibasilar airspace disease may represent pneumonia or atelectasis.  Negative for heart failure or effusion.  Negative for mass lesion.  IMPRESSION: Bibasilar atelectasis or pneumonia.   Original Report Authenticated By: Janeece Riggers, M.D.   Ct Head Wo Contrast  10/04/2012   *RADIOLOGY REPORT*  Clinical Data: Syncope  CT HEAD WITHOUT CONTRAST  Technique:  Contiguous axial images were obtained from the base of the skull through the vertex without contrast.   Comparison: None.  Findings: Ventricle size is normal.  Mild atrophy.  No acute infarct.  Negative for hemorrhage or mass.  Calvarium intact  Sinusitis with air-fluid levels in the maxillary sinus bilaterally. Diffuse mucosal thickening in the paranasal sinuses.  IMPRESSION: No acute intracranial abnormality.  Sinusitis with air-fluid levels.   Original Report Authenticated By: Janeece Riggers, M.D.   Ct Angio Chest Pe W/cm &/or Wo Cm  10/04/2012   *RADIOLOGY REPORT*  Clinical Data: Chest pain, syncope, shortness of breath.  CT ANGIOGRAPHY CHEST  Technique:  Multidetector CT imaging of the chest using the standard protocol during bolus administration of intravenous contrast. Multiplanar reconstructed images including MIPs were obtained and reviewed to evaluate the vascular anatomy.  Contrast: OMNIPAQUE IOHEXOL 350 MG/ML SOLN  Comparison: Chest x-ray 10/03/2012  Findings: Airspace disease within the lingula with air bronchograms.  Cannot exclude bronchopneumonia.  There are scattered clustered nodular densities within the lingula suggest an inflammatory process.  Bibasilar atelectasis within the lower lobes.  No pleural effusions. Heart is normal size. Aorta is normal caliber.  Scattered mediastinal lymph nodes, none pathologically enlarged.  No filling defects in the pulmonary arteries to suggest pulmonary emboli.  Changes of cirrhosis noted throughout the liver with nodular contours.  Reactive porta hepatis and gastrohepatic ligament lymph nodes.  No acute findings in the upper abdomen.  Old right-sided rib fractures.  No  acute bony abnormality.  IMPRESSION: No evidence of pulmonary embolus.  Bibasilar atelectasis.  Airspace opacity within the lingula could reflect bronchopneumonia.  Changes of cirrhosis.   Original Report Authenticated By: Charlett Nose, M.D.      Medications: I have reviewed the patient's current medications.  Impression: 1. COPD with acute exacerbation. 2. Community-acquired  pneumonia bilaterally on chest x-ray. 3. Syncopal episode, likely related to dyspnea, no further episodes. 4. Hepatitis C, chronic.     Plan: 1. Continue with all therapy including intravenous steroids and antibiotics. 2. Add steroid inhaler. See if patient needs home oxygen. 3. Hopefully discharge home tomorrow.  Consultants:  None.   Procedures:  None.   Antibiotics:  Zithromax intravenously started 10/04/2012.  Rocephin intravenously started 10/04/2012.                   Code Status: Full code.  Family Communication: Discussed plan with patient at the bedside.   Disposition Plan: Home when medically stable, possibly tomorrow.  Time spent: 20 minutes.   LOS: 1 day   Wilson Singer Pager [phone]  10/04/2012, 10:50 AM

## 2012-10-04 NOTE — Progress Notes (Signed)
Inpatient Diabetes Program Recommendations  AACE/ADA: New Consensus Statement on Inpatient Glycemic Control (2013)  Target Ranges:  Prepandial:   less than 140 mg/dL      Peak postprandial:   less than 180 mg/dL (1-2 hours)      Critically ill patients:  140 - 180 mg/dL    Results for Victor Davidson, Victor Davidson (MRN [id number]) as of 10/04/2012 08:29  Ref. Range 10/03/2012 21:01 10/04/2012 05:04  Glucose Latest Range: 70-99 mg/dL 562 (H) 130 (H)   Inpatient Diabetes Program Recommendations Correction (SSI): Please order CBGs ACHS with Novolog correction. Diet: Please consider changing diet to carb modified diabetic diet.  Note: Patient does not have a previous documented history of diabetes.  Initial blood glucose noted to be 188 mg/dl on 8/65/78 and fasting blood glucose this morning was 234 mg/dl.  Noted that patient is ordered to receive Solumedrol 80mg  Q6H which is likely cause of hyperglycemia and also Solumedrol 125 mg was given on 10/03/12 @ 18:44 (before initial lab glucose drawn).  A1C has been ordered.  Please consider ordering CBGs with Novolog correction ACHS while inpatient and on steroids.  Also, if appropriate, please consider changing diet to carb modified diabetic diet.  Will continue to follow.  Thanks, Orlando Penner, RN, MSN, CCRN Diabetes Coordinator Inpatient Diabetes Program [phone]

## 2012-10-04 NOTE — Progress Notes (Signed)
Patient ST on monitor heart rate 130-140's,B/P 154/74,no c/o pain or discomfort noted. Dr Karilyn Cota notified.Will continue to monitor patient.

## 2012-10-04 NOTE — Care Management Note (Signed)
    Page 1 of 2   10/05/2012     2:17:30 PM   CARE MANAGEMENT NOTE 10/05/2012  Patient:  Victor Davidson,Victor Davidson   Account Number:  [id number]  Date Initiated:  10/04/2012  Documentation initiated by:  Rosemary Holms  Subjective/Objective Assessment:   Pt admitted from home where he lives with his spouse. [facility] Malen Gauze in [place] as PCP. [facility]. He states that if this admission is really serious and needing inpt for days, he would like to transfer.     Action/Plan:   Anticipated DC Date:  10/06/2012   Anticipated DC Plan:  HOME/SELF CARE      DC Planning Services  CM consult      Choice offered to / List presented to:     DME arranged  [facility]      DME agency  [facility]        Status of service:  Completed, signed off Medicare Important Message given?   (If response is "NO", the following Medicare IM given date fields will be blank) Date Medicare IM given:   Date Additional Medicare IM given:    Discharge Disposition:  HOME/SELF CARE  Per UR Regulation:    If discussed at Long Length of Stay Meetings, dates discussed:    Comments:  10/05/12 1400 Arlyss Queen, RN BSN CM Pt discharged home today and needed home O2 arranged. Pt chose [facility] for DME but [facility] had to [facility]. CM contacted Nonie at the [facility] clinic and information faxed to clinic for arrangement of home O2. [facility] did give pt a portable O2 for pt to have prn until the clinic can arrange home O2 with [facility]. Pt would not wait on determination from [place] clinic for discharge. Pt verbalized understanding of consenquences of leaving before home O2 had been arranged. Pt stated that he would only use his portable O2 when up ambulating. Pts nurse aware of discharge arrangements.  10/04/12 Amy Leanord Hawking RNB SNCM Pt signed consent to transfer to [facility]. It appears the condition will be resolved in a few days. Faxed records and consent to transfer to [facility]. Per Bonita Quin, not  likely to transfer.

## 2012-10-04 NOTE — Progress Notes (Signed)
Utilization Review Complete  

## 2012-10-05 DIAGNOSIS — D72829 Elevated white blood cell count, unspecified: Secondary | ICD-10-CM | POA: Diagnosis not present

## 2012-10-05 LAB — BASIC METABOLIC PANEL
GFR calc Af Amer: >90 mL/min (ref >90)
GFR calc non Af Amer: >90 mL/min (ref >90)
Potassium: 4.3 mEq/L (ref 3.5–5.1)
Sodium: 136 mEq/L (ref 135–145)

## 2012-10-05 LAB — LEGIONELLA ANTIGEN, URINE

## 2012-10-05 LAB — CBC
MCHC: 34.8 g/dL (ref 30.0–36.0)
RDW: 12.5 % (ref 11.5–15.5)

## 2012-10-05 LAB — STREP PNEUMONIAE URINARY ANTIGEN: Strep Pneumo Urinary Antigen: NEGATIVE

## 2012-10-05 MED ORDER — AZITHROMYCIN 250 MG PO TABS: 250.0000 mg | ORAL_TABLET | Freq: Every day | ORAL | Status: AC

## 2012-10-05 MED ORDER — TEMAZEPAM 7.5 MG PO CAPS: 7.5000 mg | ORAL_CAPSULE | Freq: Every evening | ORAL | Status: DC | PRN

## 2012-10-05 MED ORDER — CEFUROXIME AXETIL 250 MG PO TABS: 250.0000 mg | ORAL_TABLET | Freq: Two times a day (BID) | ORAL | Status: DC

## 2012-10-05 MED ORDER — PREDNISONE 10 MG PO TABS: ORAL_TABLET | ORAL | Status: DC

## 2012-10-05 MED ORDER — MOMETASONE FURO-FORMOTEROL FUM 100-5 MCG/ACT IN AERO: 2.0000 | INHALATION_SPRAY | Freq: Two times a day (BID) | RESPIRATORY_TRACT | Status: DC

## 2012-10-05 MED ORDER — MOMETASONE FURO-FORMOTEROL FUM 100-5 MCG/ACT IN AERO
2.0000 | INHALATION_SPRAY | Freq: Two times a day (BID) | RESPIRATORY_TRACT | Status: DC
  Administered 2012-10-05: 2 via RESPIRATORY_TRACT
  Filled 2012-10-05: qty 8.8

## 2012-10-05 NOTE — Discharge Summary (Signed)
Physician Discharge Summary  Victor Davidson:[id number] DOB: 1950-08-17 DOA: 10/03/2012  PCP: No PCP Per Patient  Admit date: 10/03/2012 Discharge date: 10/05/2012  Time spent: 40 minutes  Recommendations for Outpatient Follow-up:  1. Follow up with PCP at [facility] in [place] Dr. Malen Gauze for evaluation of symptoms.   Discharge Diagnoses:  Principal Problem:   COPD with acute exacerbation Active Problems:   Syncope   Hepatitis C   Community acquired pneumonia   Liver cirrhosis   Thrombocytopenia, secondary   Leukocytosis, unspecified   Discharge Condition: stable  Diet recommendation: regular  Filed Weights   10/03/12 1824  Weight: 73.483 kg (162 lb)    History of present illness:  Victor Davidson is a 62 y.o. male with a past medical history of COPD, hepatitis C, liver cirrhosis, who was in his usual state of health about 3 weeks prior to admission on 10/03/12, when he started developing shortness of breath. He had a fever up to 102F. He had a cough with greenish expectoration. He took some over-the-counter medications and had the ciprofloxacin leftover from before. He started feeling some better. However, 4 days prior to presentation, he felt that he wasn't improving. Continued to have a cough and continued wheezing. And, then on 6/22 he was mowing his lawn when he felt really dizzy, started having a cough, felt more short of breath, and then proceeded to pass out. He must have been on the ground for about 3 minutes. Denied any head injuries. He had been having left-sided dull chest pain for a few days. Has also noticed epistaxis occasionally, but that had stopped. Denied any nausea, vomiting. He did not have any inhalers at home. He'd run out of his medications. Denied any leg swelling. Had a history of MRSA in his left ankle last year. Also admited that his breathing had been getting worse progressively over the last couple years   Hospital Course:  #1 syncope: Most  likely due to his respiratory symptoms.  EKG and Troponin unremarkable. d-dimer slightly elevated so CT chest done without evidence of PE.  No focal neurological deficits. CT head with no acute intracranial abnormalities. No further episodes.   #2 acute exacerbation of COPD: provided with nebulizer treatments and IV steroids as well as antibiotics. Pt improved steadily. Will discharge with prednisone taper, steroid inhaler and 5 more days of antibiotics. In addition, at discharge, pt oxygen saturation level decreased to 83% with ambulation on room air. Will discharge with home oxygen.    #3 community-acquired pneumonia: pt remained afebrile and non toxic appearing. Received 2 days of IV azithromycin and rocephin. will discharge on Ceftin and azithromycin for 5 more days.    #4 history of hepatitis C with liver cirrhosis and thrombocytopenia: INR 1.22 on admission. Alk phos 131 on admission and 11 at discharge. AST 81 on admission and 64 at discharge. Platelets stable at 91. Chart review indicates hx of same and current level baseline. Recommend OP follow up with PCP  #5 hyperglycemia: A1c 5.7 and fasting glucose 117. Likely related to steroids.   #6. Hypoxia: due to COPD. Pt saturation level dropped to 83% with ambulation. Will need home oxygen.    Procedures:  none  Consultations:  none  Discharge Exam: Filed Vitals:   10/04/12 2024 10/04/12 2127 10/05/12 0426 10/05/12 0709  BP:  165/88 147/73   Pulse:  123 107   Temp:  97.2 F (36.2 C) 97.9 F (36.6 C)   TempSrc:  Oral Oral  Resp:      Height:      Weight:      SpO2: 94% 91% 98% 98%    General: well nourished  Cardiovascular: RRR No MGR  Respiratory: normal effort BS with improved air flow but still slightly distant. Only faint wheeze no crackles no rhonchi  Discharge Instructions     Medication List    TAKE these medications       ALEVE 220 MG tablet  Generic drug:  naproxen sodium  Take 220 mg by mouth daily as  needed. For pain     amLODipine 10 MG tablet  Commonly known as:  NORVASC  Take 10 mg by mouth daily.     aspirin EC 81 MG tablet  Take 81 mg by mouth daily.     azithromycin 250 MG tablet  Commonly known as:  ZITHROMAX  Take 1 tablet (250 mg total) by mouth daily.     cefUROXime 250 MG tablet  Commonly known as:  CEFTIN  Take 1 tablet (250 mg total) by mouth 2 (two) times daily.     gabapentin 300 MG capsule  Commonly known as:  NEURONTIN  Take 600 mg by mouth daily as needed. For nerve pain     mometasone-formoterol 100-5 MCG/ACT Aero  Commonly known as:  DULERA  Inhale 2 puffs into the lungs 2 (two) times daily.     multivitamin with minerals Tabs  Take 1 tablet by mouth daily.     nicotine 7 mg/24hr patch  Commonly known as:  NICODERM CQ - dosed in mg/24 hr  Place 1 patch onto the skin daily.     predniSONE 10 MG tablet  Commonly known as:  DELTASONE  Take 6 tabs 6/25, take 4 tabs 6/26 and 6/27, take 3 tabs 6/28 and 6/29, take 2 tabs 6/30 and 7/1 take 1 tab 7/2 and 7/3 then stop.     temazepam 7.5 MG capsule  Commonly known as:  RESTORIL  Take 7.5 mg by mouth at bedtime as needed. For sleep       Allergies  Allergen Reactions  . Ketorolac Tromethamine     Burning at injection site x 3 days.       The results of significant diagnostics from this hospitalization (including imaging, microbiology, ancillary and laboratory) are listed below for reference.    Significant Diagnostic Studies: Dg Chest 2 View  10/03/2012   *RADIOLOGY REPORT*  Clinical Data: Short of breath  CHEST - 2 VIEW  Comparison: None.  Findings: Bibasilar airspace disease may represent pneumonia or atelectasis.  Negative for heart failure or effusion.  Negative for mass lesion.  IMPRESSION: Bibasilar atelectasis or pneumonia.   Original Report Authenticated By: Janeece Riggers, M.D.   Ct Head Wo Contrast  10/04/2012   *RADIOLOGY REPORT*  Clinical Data: Syncope  CT HEAD WITHOUT CONTRAST   Technique:  Contiguous axial images were obtained from the base of the skull through the vertex without contrast.  Comparison: None.  Findings: Ventricle size is normal.  Mild atrophy.  No acute infarct.  Negative for hemorrhage or mass.  Calvarium intact  Sinusitis with air-fluid levels in the maxillary sinus bilaterally. Diffuse mucosal thickening in the paranasal sinuses.  IMPRESSION: No acute intracranial abnormality.  Sinusitis with air-fluid levels.   Original Report Authenticated By: Janeece Riggers, M.D.   Ct Angio Chest Pe W/cm &/or Wo Cm  10/04/2012   *RADIOLOGY REPORT*  Clinical Data: Chest pain, syncope, shortness of breath.  CT ANGIOGRAPHY CHEST  Technique:  Multidetector CT imaging of the chest using the standard protocol during bolus administration of intravenous contrast. Multiplanar reconstructed images including MIPs were obtained and reviewed to evaluate the vascular anatomy.  Contrast: OMNIPAQUE IOHEXOL 350 MG/ML SOLN  Comparison: Chest x-ray 10/03/2012  Findings: Airspace disease within the lingula with air bronchograms.  Cannot exclude bronchopneumonia.  There are scattered clustered nodular densities within the lingula suggest an inflammatory process.  Bibasilar atelectasis within the lower lobes.  No pleural effusions. Heart is normal size. Aorta is normal caliber.  Scattered mediastinal lymph nodes, none pathologically enlarged.  No filling defects in the pulmonary arteries to suggest pulmonary emboli.  Changes of cirrhosis noted throughout the liver with nodular contours.  Reactive porta hepatis and gastrohepatic ligament lymph nodes.  No acute findings in the upper abdomen.  Old right-sided rib fractures.  No acute bony abnormality.  IMPRESSION: No evidence of pulmonary embolus.  Bibasilar atelectasis.  Airspace opacity within the lingula could reflect bronchopneumonia.  Changes of cirrhosis.   Original Report Authenticated By: Charlett Nose, M.D.      Labs: Basic Metabolic  Panel:  Recent Labs Lab 10/03/12 2101 10/04/12 0504 10/05/12 0535  NA 137 133* 136  K 3.9 4.0 4.3  CL 101 100 102  CO2 25 25 27   GLUCOSE 188* 234* 181*  BUN 4* 5* 10  CREATININE 0.74 0.72 0.72  CALCIUM 8.9 8.4 8.7   Liver Function Tests:  Recent Labs Lab 10/03/12 2101 10/04/12 0504  AST 81* 64*  ALT 42 37  ALKPHOS 131* 111  BILITOT 0.5 0.4  PROT 8.0 7.4  ALBUMIN 2.7* 2.4*     CBC:  Recent Labs Lab 10/03/12 2101 10/04/12 0504 10/05/12 0535  WBC 5.0 3.9* 14.7*  HGB 14.4 13.5 13.6  HCT 41.1 39.0 39.1  MCV 97.2 96.3 96.8  PLT 98* 90* 91*   Cardiac Enzymes:  Recent Labs Lab 10/03/12 2101 10/04/12 0504 10/04/12 1105  TROPONINI <0.30 <0.30 <0.30         Signed:  Toya Smothers M  [facility] 10/05/2012, 9:49 AM Attending: Patient seen and examined. Patient is now stable for discharge. He will need home oxygen. He will followup with the [place].

## 2012-10-05 NOTE — Progress Notes (Signed)
Speech Language Pathology Dysphagia Treatment Patient Details Name: Victor Davidson MRN: [id number] DOB: 08-10-1950 Today's Date: 10/05/2012 Time: 5784-6962 SLP Time Calculation (min): 14 min  Assessment / Plan / Recommendation Clinical Impression  Spoke with RN and MD regarding recommendations for esophageal testing.  Pt will be [facility] through [facility] for esophageal [facility]up.  Discussed safe swallow precautions/reflux precautions with pt, including small bites/sips, slow rate, beginning meal with warm beverage, alternating solids and liquids during meal, remaining upright 30-45 minutes after meals.  Pt verbalized understanding of this information.    Diet Recommendation  Continue with Current Diet: Regular;Thin liquid    SLP Plan All goals met;Discharge SLP treatment due to (comment) (DCing [facility] acute care.  [facility]   Pertinent Vitals/Pain No pain reported   Swallowing Goals  SLP Swallowing Goals Swallow Study Goal #1 - Progress: Met Swallow Study Goal #2 - Progress: Met  General Temperature Spikes Noted: No Respiratory Status: Room air Behavior/Cognition: Alert;Cooperative;Pleasant mood Oral Cavity - Dentition: Missing dentition Patient Positioning:  (standing up)  Oral Cavity - Oral Hygiene Does patient have any of the following "at risk" factors?: None of the above Brush patient's teeth BID with toothbrush (using toothpaste with fluoride): Yes   Dysphagia Treatment Treatment focused on: Patient/family/caregiver education;Skilled observation of diet tolerance Treatment Methods/Modalities: Skilled observation Patient observed directly with PO's: Yes Type of PO's observed: Regular;Thin liquids Feeding: Able to feed self Liquids provided via: Cup Type of cueing: Verbal Amount of cueing: Minimal   Victor Davidson B. McIntire, [name], [name]   Victor Davidson 10/05/2012, 12:19 PM

## 2012-10-05 NOTE — Progress Notes (Signed)
Inpatient Diabetes Program Recommendations  AACE/ADA: New Consensus Statement on Inpatient Glycemic Control (2013)  Target Ranges:  Prepandial:   less than 140 mg/dL      Peak postprandial:   less than 180 mg/dL (1-2 hours)      Critically ill patients:  140 - 180 mg/dL   Results for Victor Davidson, Victor Davidson (MRN [id number]) as of 10/05/2012 09:22  Ref. Range 10/03/2012 21:01 10/04/2012 05:04 10/04/2012 08:33 10/05/2012 05:35  Hemoglobin A1C Latest Range: <5.7 %   5.7 (H)   Glucose Latest Range: 70-99 mg/dL 409 (H) 811 (H)  914 (H)   Inpatient Diabetes Program Recommendations Correction (SSI): Please order CBGs ACHS with Novolog correction while on steroids.   Note: Patient does not have a previous documented history of diabetes. Initial blood glucose noted to be 188 mg/dl on 7/82/95 and fasting blood glucose this morning was 181 mg/dl. Noted that patient is ordered to receive Solumedrol 80mg  Q6H which is likely cause of hyperglycemia and  Solumedrol 125 mg was given on 10/03/12 @ 18:44 (before initial lab glucose drawn). A1C noted to be 5.7%. Please consider ordering CBGs with Novolog correction ACHS while inpatient and on steroids.  If appropriate,may want to consider changing diet to carb modified diabetic diet. Will continue to follow.  Thanks,  Orlando Penner, RN, MSN, CCRN  Diabetes Coordinator  Inpatient Diabetes Program  [phone]

## 2012-10-05 NOTE — Progress Notes (Signed)
Patient states understanding of discharge instructions. Prescriptions given. Home with a portable tank of 02 .

## 2012-10-05 NOTE — Progress Notes (Addendum)
02 sat on ra at rest 92%. 02 sat on ra ambulating 83%. 02 sat on 02 at 2l resting is 97%. 02 sat on 02 at 2l ambulating is 98%.  Heartrate with ambulation 147 and at rest 128.

## 2012-11-06 ENCOUNTER — Encounter (HOSPITAL_COMMUNITY): Payer: Self-pay

## 2012-11-06 ENCOUNTER — Emergency Department (HOSPITAL_COMMUNITY)
Admission: EM | Admit: 2012-11-06 | Discharge: 2012-11-06 | Disposition: A | Discharge status: Home / Self Care | Payer: Non-veteran care | Attending: Emergency Medicine | Admitting: Emergency Medicine

## 2012-11-06 DIAGNOSIS — Y93H2 Activity, gardening and landscaping: Secondary | ICD-10-CM | POA: Insufficient documentation

## 2012-11-06 DIAGNOSIS — Z8701 Personal history of pneumonia (recurrent): Secondary | ICD-10-CM | POA: Insufficient documentation

## 2012-11-06 DIAGNOSIS — Y92009 Unspecified place in unspecified non-institutional (private) residence as the place of occurrence of the external cause: Secondary | ICD-10-CM | POA: Insufficient documentation

## 2012-11-06 DIAGNOSIS — M25569 Pain in unspecified knee: Secondary | ICD-10-CM | POA: Insufficient documentation

## 2012-11-06 DIAGNOSIS — J441 Chronic obstructive pulmonary disease with (acute) exacerbation: Secondary | ICD-10-CM | POA: Insufficient documentation

## 2012-11-06 DIAGNOSIS — G8929 Other chronic pain: Secondary | ICD-10-CM | POA: Insufficient documentation

## 2012-11-06 DIAGNOSIS — Z8619 Personal history of other infectious and parasitic diseases: Secondary | ICD-10-CM | POA: Insufficient documentation

## 2012-11-06 DIAGNOSIS — Z79899 Other long term (current) drug therapy: Secondary | ICD-10-CM | POA: Insufficient documentation

## 2012-11-06 DIAGNOSIS — I10 Essential (primary) hypertension: Secondary | ICD-10-CM | POA: Insufficient documentation

## 2012-11-06 DIAGNOSIS — X503XXA Overexertion from repetitive movements, initial encounter: Secondary | ICD-10-CM | POA: Insufficient documentation

## 2012-11-06 DIAGNOSIS — S335XXA Sprain of ligaments of lumbar spine, initial encounter: Secondary | ICD-10-CM | POA: Insufficient documentation

## 2012-11-06 DIAGNOSIS — Z7982 Long term (current) use of aspirin: Secondary | ICD-10-CM | POA: Insufficient documentation

## 2012-11-06 DIAGNOSIS — S39012A Strain of muscle, fascia and tendon of lower back, initial encounter: Secondary | ICD-10-CM

## 2012-11-06 DIAGNOSIS — Z8739 Personal history of other diseases of the musculoskeletal system and connective tissue: Secondary | ICD-10-CM | POA: Insufficient documentation

## 2012-11-06 DIAGNOSIS — Z87891 Personal history of nicotine dependence: Secondary | ICD-10-CM | POA: Insufficient documentation

## 2012-11-06 MED ORDER — CYCLOBENZAPRINE HCL 10 MG PO TABS: 10.0000 mg | ORAL_TABLET | Freq: Two times a day (BID) | ORAL | Status: DC | PRN

## 2012-11-06 MED ORDER — HYDROCODONE-IBUPROFEN 7.5-200 MG PO TABS
1.0000 | ORAL_TABLET | Freq: Four times a day (QID) | ORAL | Status: DC | PRN
Start: 2012-11-06 — End: 2012-12-15

## 2012-11-06 NOTE — ED Provider Notes (Signed)
CSN: [id number]     Arrival date & time 11/06/12  4098 History     First MD Initiated Contact with Patient 11/06/12 1015     Chief Complaint  Patient presents with  . Back Pain   (Consider location/radiation/quality/duration/timing/severity/associated sxs/prior Treatment) Patient is a 62 y.o. male presenting with back pain. The history is provided by the patient.  Back Pain Location:  Lumbar spine Quality:  Shooting Radiates to:  L thigh and R thigh Onset quality:  Gradual Relieved by:  Nothing Worsened by:  Movement, standing, ambulation, bending and palpation Ineffective treatments:  None tried Associated symptoms: no abdominal pain, no dysuria and no fever    Victor Davidson is a 62 y.o. male who presents to the ED with low back pain that started a few days ago. He states he was working in the garden and bending to pull weeds and began having pain in his lower back. Hx of chronic low back pain after jumping out of [place] while in the [employer]. Is followed by the [facility] hospital. He is currently taking antibiotics for pneumonia that he was hospitalized for. He is partially disabled but works at home while his wife works outside the home.  Past Medical History  Diagnosis Date  . Hypertension   . Herniated disc   . Hepatitis C   . COPD (chronic obstructive pulmonary disease)   . Chronic pain   . Chronic pain of left knee    Past Surgical History  Procedure Laterality Date  . Knee surgery    . Total hip arthroplasty    . Tonsillectomy    . Joint replacement    . Ankle surgery     Family History  Problem Relation Age of Onset  . Cancer Mother    History  Substance Use Topics  . Smoking status: Former Smoker -- 30 years    Quit date: 09/02/2012  . Smokeless tobacco: Former Neurosurgeon    Types: Chew  . Alcohol Use: No     Comment: occassional    Review of Systems  Constitutional: Negative for fever and chills.  HENT: Negative for neck pain.   Gastrointestinal: Negative  for nausea, vomiting and abdominal pain.  Genitourinary: Negative for dysuria and frequency.  Musculoskeletal: Positive for back pain.  Skin: Negative for rash and wound.  Psychiatric/Behavioral: The patient is not nervous/anxious.     Allergies  Ketorolac tromethamine  Home Medications   Current Outpatient Rx  Name  Route  Sig  Dispense  Refill  . amLODipine (NORVASC) 10 MG tablet   Oral   Take 10 mg by mouth daily.          Marland Kitchen aspirin EC 81 MG tablet   Oral   Take 81 mg by mouth daily.         . cefUROXime (CEFTIN) 250 MG tablet   Oral   Take 1 tablet (250 mg total) by mouth 2 (two) times daily.   10 tablet   0   . gabapentin (NEURONTIN) 300 MG capsule   Oral   Take 600 mg by mouth daily as needed. For nerve pain         . mometasone-formoterol (DULERA) 100-5 MCG/ACT AERO   Inhalation   Inhale 2 puffs into the lungs 2 (two) times daily.   1 Inhaler   0   . Multiple Vitamin (MULTIVITAMIN WITH MINERALS) TABS   Oral   Take 1 tablet by mouth daily.         Marland Kitchen  naproxen sodium (ALEVE) 220 MG tablet   Oral   Take 220 mg by mouth daily as needed. For pain         . nicotine (NICODERM CQ - DOSED IN MG/24 HR) 7 mg/24hr patch   Transdermal   Place 1 patch onto the skin daily.         . predniSONE (DELTASONE) 10 MG tablet      Take 6 tabs 6/25, take 4 tabs 6/26 and 6/27, take 3 tabs 6/28 and 6/29, take 2 tabs 6/30 and 7/1 take 1 tab 7/2 and 7/3 then stop.   26 tablet   0   . temazepam (RESTORIL) 7.5 MG capsule   Oral   Take 1 capsule (7.5 mg total) by mouth at bedtime as needed for sleep. For sleep   30 capsule   0    BP 131/90  Pulse 62  Temp(Src) 98 F (36.7 C) (Oral)  Resp 20  Ht 5\' 6"  (1.676 m)  Wt 163 lb (73.936 kg)  BMI 26.32 kg/m2  SpO2 96% Physical Exam  Nursing note and vitals reviewed. Constitutional: He is oriented to person, place, and time. He appears well-developed and well-nourished. No distress.  HENT:  Head: Normocephalic.    Eyes: EOM are normal.  Neck: Normal range of motion. Neck supple.  Cardiovascular: Normal rate and regular rhythm.   Pulmonary/Chest: Effort normal. No respiratory distress. He has wheezes.  Rhonchi and wheezing. Currently on antibiotics. Hx of COPD and recent pneumonia   Abdominal: Soft. Bowel sounds are normal. There is no tenderness.  Musculoskeletal:       Lumbar back: He exhibits decreased range of motion, tenderness and spasm. He exhibits no laceration.       Back:  Pedal pulses equal, adequate circulation, good touch sensation. Straight leg raises without difficulty. Steady gait without foot drag. Tender with palpation and range of motion lower lumbar area with radiation to both upper legs.  Neurological: He is alert and oriented to person, place, and time. He has normal strength and normal reflexes. No cranial nerve deficit or sensory deficit. Gait normal.  Skin: Skin is warm and dry.  Psychiatric: He has a normal mood and affect. His behavior is normal.    ED Course   Procedures  MDM  62 y.o. male with lumbar strain after bending to pull weeds in his garden. Will treat for pain. Patient stable for discharge. Neurovascular exam normal.  Discussed with the patient clinical findings and plan of care and all questioned fully answered. He will return if any problems arise.    Medication List    STOP taking these medications       ALEVE 220 MG tablet  Generic drug:  naproxen sodium      TAKE these medications       cyclobenzaprine 10 MG tablet  Commonly known as:  FLEXERIL  Take 1 tablet (10 mg total) by mouth 2 (two) times daily as needed for muscle spasms.     HYDROcodone-ibuprofen 7.5-200 MG per tablet  Commonly known as:  VICOPROFEN  Take 1 tablet by mouth every 6 (six) hours as needed for pain.      ASK your doctor about these medications       amLODipine 10 MG tablet  Commonly known as:  NORVASC  Take 10 mg by mouth daily.     aspirin EC 81 MG tablet   Take 81 mg by mouth daily.     cefUROXime 250 MG tablet  Commonly known as:  CEFTIN  Take 1 tablet (250 mg total) by mouth 2 (two) times daily.     gabapentin 300 MG capsule  Commonly known as:  NEURONTIN  Take 600 mg by mouth daily as needed. For nerve pain     mometasone-formoterol 100-5 MCG/ACT Aero  Commonly known as:  DULERA  Inhale 2 puffs into the lungs 2 (two) times daily.     multivitamin with minerals Tabs  Take 1 tablet by mouth daily.     nicotine 7 mg/24hr patch  Commonly known as:  NICODERM CQ - dosed in mg/24 hr  Place 1 patch onto the skin daily.     predniSONE 10 MG tablet  Commonly known as:  DELTASONE  Take 6 tabs 6/25, take 4 tabs 6/26 and 6/27, take 3 tabs 6/28 and 6/29, take 2 tabs 6/30 and 7/1 take 1 tab 7/2 and 7/3 then stop.     temazepam 7.5 MG capsule  Commonly known as:  RESTORIL  Take 1 capsule (7.5 mg total) by mouth at bedtime as needed for sleep. For sleep         [facility], NP 11/06/12 1415

## 2012-11-06 NOTE — ED Notes (Signed)
Pt c/o lower back pain radiating down both legs.  Reports has chronic back pain.  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

## 2012-11-06 NOTE — ED Provider Notes (Signed)
Medical screening examination/treatment/procedure(s) were performed by non-physician practitioner and as supervising physician I was immediately available for consultation/collaboration.   Carleene Cooper III, MD 11/06/12 [phone]

## 2012-12-15 ENCOUNTER — Encounter (HOSPITAL_COMMUNITY): Payer: Self-pay

## 2012-12-15 ENCOUNTER — Emergency Department (HOSPITAL_COMMUNITY)
Admission: EM | Admit: 2012-12-15 | Discharge: 2012-12-15 | Disposition: A | Discharge status: Home / Self Care | Payer: Non-veteran care | Attending: Emergency Medicine | Admitting: Emergency Medicine

## 2012-12-15 DIAGNOSIS — Y9239 Other specified sports and athletic area as the place of occurrence of the external cause: Secondary | ICD-10-CM | POA: Insufficient documentation

## 2012-12-15 DIAGNOSIS — Z79899 Other long term (current) drug therapy: Secondary | ICD-10-CM | POA: Insufficient documentation

## 2012-12-15 DIAGNOSIS — J441 Chronic obstructive pulmonary disease with (acute) exacerbation: Secondary | ICD-10-CM | POA: Insufficient documentation

## 2012-12-15 DIAGNOSIS — I10 Essential (primary) hypertension: Secondary | ICD-10-CM | POA: Insufficient documentation

## 2012-12-15 DIAGNOSIS — Z8619 Personal history of other infectious and parasitic diseases: Secondary | ICD-10-CM | POA: Insufficient documentation

## 2012-12-15 DIAGNOSIS — M25569 Pain in unspecified knee: Secondary | ICD-10-CM | POA: Insufficient documentation

## 2012-12-15 DIAGNOSIS — IMO0002 Reserved for concepts with insufficient information to code with codable children: Secondary | ICD-10-CM | POA: Insufficient documentation

## 2012-12-15 DIAGNOSIS — Z7982 Long term (current) use of aspirin: Secondary | ICD-10-CM | POA: Insufficient documentation

## 2012-12-15 DIAGNOSIS — Y93I1 Activity, roller coaster riding: Secondary | ICD-10-CM | POA: Insufficient documentation

## 2012-12-15 DIAGNOSIS — Z8739 Personal history of other diseases of the musculoskeletal system and connective tissue: Secondary | ICD-10-CM | POA: Insufficient documentation

## 2012-12-15 DIAGNOSIS — X500XXA Overexertion from strenuous movement or load, initial encounter: Secondary | ICD-10-CM | POA: Insufficient documentation

## 2012-12-15 DIAGNOSIS — M545 Low back pain: Secondary | ICD-10-CM

## 2012-12-15 DIAGNOSIS — G8929 Other chronic pain: Secondary | ICD-10-CM | POA: Insufficient documentation

## 2012-12-15 MED ORDER — CYCLOBENZAPRINE HCL 10 MG PO TABS: 10.0000 mg | ORAL_TABLET | Freq: Two times a day (BID) | ORAL | Status: DC | PRN

## 2012-12-15 MED ORDER — HYDROCODONE-ACETAMINOPHEN 5-325 MG PO TABS
2.0000 | ORAL_TABLET | Freq: Once | ORAL | Status: AC
  Administered 2012-12-15: 2 via ORAL
  Filled 2012-12-15: qty 2

## 2012-12-15 MED ORDER — PREDNISONE 50 MG PO TABS
60.0000 mg | ORAL_TABLET | Freq: Once | ORAL | Status: AC
  Administered 2012-12-15: 60 mg via ORAL
  Filled 2012-12-15: qty 1

## 2012-12-15 MED ORDER — HYDROCODONE-ACETAMINOPHEN 7.5-325 MG PO TABS: 1.0000 | ORAL_TABLET | ORAL | Status: DC | PRN

## 2012-12-15 MED ORDER — KETOROLAC TROMETHAMINE 10 MG PO TABS
10.0000 mg | ORAL_TABLET | Freq: Once | ORAL | Status: DC
  Filled 2012-12-15: qty 1

## 2012-12-15 NOTE — ED Provider Notes (Signed)
CSN: [id number]     Arrival date & time 12/15/12  1444 History   None    Chief Complaint  Patient presents with  . Back Pain   (Consider location/radiation/quality/duration/timing/severity/associated sxs/prior Treatment) Patient is a 62 y.o. male presenting with back pain. The history is provided by the patient.  Back Pain Location:  Lumbar spine Quality:  Aching and cramping Radiates to:  L thigh and R thigh Pain severity:  Moderate Pain is:  Same all the time Onset quality:  Gradual Timing:  Constant Progression:  Worsening Chronicity:  Chronic Context: physical stress and twisting   Context comment:  Pt road a roller coaster yesterday and re-injured his back. Relieved by:  Nothing Worsened by:  Movement Associated symptoms: no abdominal pain, no bladder incontinence, no bowel incontinence, no chest pain, no dysuria and no perianal numbness     Past Medical History  Diagnosis Date  . Hypertension   . Herniated disc   . Hepatitis C   . COPD (chronic obstructive pulmonary disease)   . Chronic pain   . Chronic pain of left knee    Past Surgical History  Procedure Laterality Date  . Knee surgery    . Total hip arthroplasty    . Tonsillectomy    . Joint replacement    . Ankle surgery     Family History  Problem Relation Age of Onset  . Cancer Mother    History  Substance Use Topics  . Smoking status: Former Smoker -- 30 years    Quit date: 09/02/2012  . Smokeless tobacco: Former Neurosurgeon    Types: Chew  . Alcohol Use: No     Comment: occassional    Review of Systems  Constitutional: Negative for activity change.       All ROS Neg except as noted in HPI  HENT: Negative for nosebleeds and neck pain.   Eyes: Negative for photophobia and discharge.  Respiratory: Positive for shortness of breath. Negative for cough and wheezing.   Cardiovascular: Negative for chest pain and palpitations.  Gastrointestinal: Negative for abdominal pain, blood in stool and bowel  incontinence.  Genitourinary: Negative for bladder incontinence, dysuria, frequency and hematuria.  Musculoskeletal: Positive for back pain and arthralgias.  Skin: Negative.   Neurological: Negative for dizziness, seizures and speech difficulty.  Psychiatric/Behavioral: Negative for hallucinations and confusion.    Allergies  Ketorolac tromethamine  Home Medications   Current Outpatient Rx  Name  Route  Sig  Dispense  Refill  . amLODipine (NORVASC) 10 MG tablet   Oral   Take 10 mg by mouth daily.          Marland Kitchen aspirin EC 81 MG tablet   Oral   Take 81 mg by mouth daily.         Marland Kitchen gabapentin (NEURONTIN) 300 MG capsule   Oral   Take 600 mg by mouth daily as needed. For nerve pain         . mometasone-formoterol (DULERA) 100-5 MCG/ACT AERO   Inhalation   Inhale 2 puffs into the lungs 2 (two) times daily.   1 Inhaler   0   . Multiple Vitamin (MULTIVITAMIN WITH MINERALS) TABS   Oral   Take 1 tablet by mouth daily.         . nicotine (NICODERM CQ - DOSED IN MG/24 HR) 7 mg/24hr patch   Transdermal   Place 1 patch onto the skin daily.         . temazepam (RESTORIL)  7.5 MG capsule   Oral   Take 1 capsule (7.5 mg total) by mouth at bedtime as needed for sleep. For sleep   30 capsule   0    BP 137/68  Pulse 70  Temp(Src) 98.1 F (36.7 C) (Oral)  Resp 22  Ht 5\' 6"  (1.676 m)  Wt 190 lb (86.183 kg)  BMI 30.68 kg/m2  SpO2 97% Physical Exam  Nursing note and vitals reviewed. Constitutional: He is oriented to person, place, and time. He appears well-developed and well-nourished.  Non-toxic appearance.  HENT:  Head: Normocephalic.  Right Ear: Tympanic membrane and external ear normal.  Left Ear: Tympanic membrane and external ear normal.  Eyes: EOM and lids are normal. Pupils are equal, round, and reactive to light.  Neck: Normal range of motion. Neck supple. Carotid bruit is not present.  Cardiovascular: Normal rate, regular rhythm, normal heart sounds, intact  distal pulses and normal pulses.   Pulmonary/Chest: Breath sounds normal. No respiratory distress.  Abdominal: Soft. Bowel sounds are normal. There is no tenderness. There is no guarding.  Musculoskeletal: Normal range of motion.  Lymphadenopathy:       Head (right side): No submandibular adenopathy present.       Head (left side): No submandibular adenopathy present.    He has no cervical adenopathy.  Neurological: He is alert and oriented to person, place, and time. He has normal strength. No cranial nerve deficit or sensory deficit.  Skin: Skin is warm and dry.  Psychiatric: He has a normal mood and affect. His speech is normal.    ED Course  Procedures (including critical care time) Labs Review Labs Reviewed - No data to display Imaging Review No results found.  MDM   1. Back pain, lumbosacral    **I have reviewed nursing notes, vital signs, and all appropriate lab and imaging results for this patient.*  Pt rode the roller coaster yesterday and reinjured the lower back. He cannot see MD at the [facility] for 2 weeks. No gross neuro deficit noted. No reported loss of bowel or bladder. Rx for flexeril 10 and norco. (Pt can not take toradol) given to the patient.  Kathie Dike, PA-C 12/15/12 1601

## 2012-12-15 NOTE — ED Notes (Signed)
H. Bryant, PA at bedside. 

## 2012-12-15 NOTE — ED Notes (Signed)
Pt reports back pain for years, rode the "[place] rollercoaster" and now having pain.   Has no pain meds to take. Is scheduled for another mri and pain management doctor on 9/16

## 2012-12-16 NOTE — ED Provider Notes (Signed)
Medical screening examination/treatment/procedure(s) were performed by non-physician practitioner and as supervising physician I was immediately available for consultation/collaboration.   Averianna Brugger L Makilah Dowda, MD 12/16/12 0711 

## 2013-02-06 ENCOUNTER — Emergency Department (HOSPITAL_COMMUNITY)
Admission: EM | Admit: 2013-02-06 | Discharge: 2013-02-06 | Disposition: A | Discharge status: Home / Self Care | Payer: Non-veteran care | Attending: Emergency Medicine | Admitting: Emergency Medicine

## 2013-02-06 ENCOUNTER — Encounter (HOSPITAL_COMMUNITY): Payer: Self-pay | Admitting: Emergency Medicine

## 2013-02-06 ENCOUNTER — Emergency Department (HOSPITAL_COMMUNITY): Payer: Non-veteran care

## 2013-02-06 DIAGNOSIS — Z7982 Long term (current) use of aspirin: Secondary | ICD-10-CM | POA: Insufficient documentation

## 2013-02-06 DIAGNOSIS — S8990XA Unspecified injury of unspecified lower leg, initial encounter: Secondary | ICD-10-CM | POA: Insufficient documentation

## 2013-02-06 DIAGNOSIS — Z87891 Personal history of nicotine dependence: Secondary | ICD-10-CM | POA: Insufficient documentation

## 2013-02-06 DIAGNOSIS — Z8619 Personal history of other infectious and parasitic diseases: Secondary | ICD-10-CM | POA: Insufficient documentation

## 2013-02-06 DIAGNOSIS — J4489 Other specified chronic obstructive pulmonary disease: Secondary | ICD-10-CM | POA: Insufficient documentation

## 2013-02-06 DIAGNOSIS — S99912A Unspecified injury of left ankle, initial encounter: Secondary | ICD-10-CM

## 2013-02-06 DIAGNOSIS — Y9389 Activity, other specified: Secondary | ICD-10-CM | POA: Insufficient documentation

## 2013-02-06 DIAGNOSIS — I10 Essential (primary) hypertension: Secondary | ICD-10-CM | POA: Insufficient documentation

## 2013-02-06 DIAGNOSIS — Y9289 Other specified places as the place of occurrence of the external cause: Secondary | ICD-10-CM | POA: Insufficient documentation

## 2013-02-06 DIAGNOSIS — R296 Repeated falls: Secondary | ICD-10-CM | POA: Insufficient documentation

## 2013-02-06 DIAGNOSIS — J449 Chronic obstructive pulmonary disease, unspecified: Secondary | ICD-10-CM | POA: Insufficient documentation

## 2013-02-06 DIAGNOSIS — Z8739 Personal history of other diseases of the musculoskeletal system and connective tissue: Secondary | ICD-10-CM | POA: Insufficient documentation

## 2013-02-06 DIAGNOSIS — Z79899 Other long term (current) drug therapy: Secondary | ICD-10-CM | POA: Insufficient documentation

## 2013-02-06 MED ORDER — OXYCODONE-ACETAMINOPHEN 5-325 MG PO TABS: 2.0000 | ORAL_TABLET | ORAL | Status: DC | PRN

## 2013-02-06 MED ORDER — HYDROCODONE-ACETAMINOPHEN 5-325 MG PO TABS: 1.0000 | ORAL_TABLET | ORAL | Status: DC | PRN

## 2013-02-06 NOTE — ED Notes (Signed)
Pt was working in his garage, fell with his foot caught in the ladder. Pt has had 2 prior surgeries on the same ankle. Ankle has been broken twice.

## 2013-02-06 NOTE — ED Provider Notes (Signed)
CSN: [id number]     Arrival date & time 02/06/13  1952 History   First MD Initiated Contact with Patient 02/06/13 2003     Chief Complaint  Patient presents with  . Ankle Pain   (Consider location/radiation/quality/duration/timing/severity/associated sxs/prior Treatment) Patient is a 62 y.o. male presenting with ankle pain. The history is provided by the patient.  Ankle Pain Location:  Ankle Injury: yes   Mechanism of injury: fall   Fall:    Fall occurred: foot caught in step ladder.   Impact surface:  Concrete   Entrapped after fall: no   Ankle location:  L ankle Pain details:    Quality:  Aching and sharp   Radiates to:  Does not radiate   Severity:  Moderate   Onset quality:  Sudden   Duration:  3 hours   Timing:  Constant   Progression:  Unchanged Chronicity:  New Dislocation: no   Foreign body present:  No foreign bodies Prior injury to area:  Yes Worsened by:  Activity and bearing weight Ineffective treatments:  None tried Associated symptoms: no fever    Victor Davidson is a 62 y.o. male who presents to the ED with left ankle pain. He was working in his garage and caught his foot in a ladder and then fell. The ankle twisted. He has had 2 prior surgeries on the same ankle. Accident happened approximately 5:30 pm. Patient complains of pain and swelling to the ankle. Past Medical History  Diagnosis Date  . Hypertension   . Herniated disc   . Hepatitis C   . COPD (chronic obstructive pulmonary disease)   . Chronic pain   . Chronic pain of left knee    Past Surgical History  Procedure Laterality Date  . Knee surgery    . Total hip arthroplasty    . Tonsillectomy    . Joint replacement    . Ankle surgery     Family History  Problem Relation Age of Onset  . Cancer Mother    History  Substance Use Topics  . Smoking status: Former Smoker -- 30 years    Quit date: 09/02/2012  . Smokeless tobacco: Former Neurosurgeon    Types: Chew  . Alcohol Use: No     Comment:  occassional    Review of Systems  Constitutional: Negative for fever.  HENT: Negative.   Eyes: Negative for visual disturbance.  Respiratory: Cough: COPD.   Gastrointestinal: Negative for nausea and vomiting.  Musculoskeletal:       Left ankle pain  Skin: Negative for wound.  Neurological: Negative for syncope.  Psychiatric/Behavioral: The patient is not nervous/anxious.     Allergies  Ketorolac tromethamine  Home Medications   Current Outpatient Rx  Name  Route  Sig  Dispense  Refill  . amLODipine (NORVASC) 10 MG tablet   Oral   Take 10 mg by mouth daily.          Marland Kitchen aspirin EC 81 MG tablet   Oral   Take 81 mg by mouth daily.         . cyclobenzaprine (FLEXERIL) 10 MG tablet   Oral   Take 1 tablet (10 mg total) by mouth 2 (two) times daily as needed for muscle spasms.   20 tablet   0   . gabapentin (NEURONTIN) 300 MG capsule   Oral   Take 600 mg by mouth daily as needed. For nerve pain         . HYDROcodone-acetaminophen (NORCO)  7.5-325 MG per tablet   Oral   Take 1 tablet by mouth every 4 (four) hours as needed for pain.   20 tablet   0   . mometasone-formoterol (DULERA) 100-5 MCG/ACT AERO   Inhalation   Inhale 2 puffs into the lungs 2 (two) times daily.   1 Inhaler   0   . Multiple Vitamin (MULTIVITAMIN WITH MINERALS) TABS   Oral   Take 1 tablet by mouth daily.         . nicotine (NICODERM CQ - DOSED IN MG/24 HR) 7 mg/24hr patch   Transdermal   Place 1 patch onto the skin daily.         . temazepam (RESTORIL) 7.5 MG capsule   Oral   Take 1 capsule (7.5 mg total) by mouth at bedtime as needed for sleep. For sleep   30 capsule   0    BP 143/77  Pulse 65  Temp(Src) 97.8 F (36.6 C) (Oral)  Resp 20  Ht 5\' 6"  (1.676 m)  Wt 170 lb (77.111 kg)  BMI 27.45 kg/m2  SpO2 97% Physical Exam  Nursing note and vitals reviewed. Constitutional: He is oriented to person, place, and time. He appears well-developed and well-nourished. No distress.   HENT:  Head: Normocephalic and atraumatic.  Eyes: EOM are normal.  Neck: Normal range of motion. Neck supple.  Cardiovascular: Normal rate.   Pulmonary/Chest: Effort normal.  Abdominal: Soft. There is no tenderness.  Musculoskeletal:       Left ankle: He exhibits decreased range of motion and swelling (mild). He exhibits no deformity and normal pulse. Tenderness. Medial malleolus tenderness found.  Pedal pulse present, adequate circulation.   Neurological: He is alert and oriented to person, place, and time. No cranial nerve deficit.  Skin: Skin is warm and dry.  Psychiatric: He has a normal mood and affect. His behavior is normal.    ED Course: Dr. Manus Gunning in to examine the patient. Will consult Dr. Romeo Apple (ortho on call re x-ray results).   Procedures  Dg Ankle Complete Left  02/06/2013   CLINICAL DATA:  Ankle pain. Injury today. Surgery on ankle in February. Swelling.  EXAM: LEFT ANKLE COMPLETE - 3+ VIEW  COMPARISON:  02/08/2012  FINDINGS: Medial malleolar lag screws noted. The medial malleolar fracture has views.  Lateral plate and screw fixators of the fibula have been removed. There is mild sclerosis at the site of prior concern for infection along the fibula.  Mild tibiotalar spurring noted. There is soft tissue swelling below the medial malleolus.  No new fracture identified.  IMPRESSION: 1. There is sclerosis in the distal fibular shaft at the site of prior concern for infection along the implant. Chronic osteomyelitis is not totally excluded, and MRI with and without contrast would be recommended if this was the clinical concern in order to provide definitive imaging characterization. 2. Lag screws in the medial malleolus with satisfactory bony bridging of the underlying fracture. There is soft tissue swelling below the medial malleolus of uncertain etiology.   Electronically Signed   By: Herbie Baltimore M.D.   On: 02/06/2013 20:26    MDM: discussed with Dr. Romeo Apple and he  recommends that the patient follow up with the orthopedic doctor who did his surgery. I read the x-ray report and he does not feel there is anything that needs to be done tonight.   62 y.o. male with left ankle pain s/p fall earlier tonight. Placed in ASO, ice, elevation. Patient stable  for discharge home without any immediate complications. He will call Dr. Case tomorrow for follow up and take his x-rays from tonight with him.     [place], [place] 02/06/13 2112

## 2013-02-07 MED FILL — Oxycodone w/ Acetaminophen Tab 5-325 MG: ORAL | Qty: 6 | Status: AC

## 2013-02-07 NOTE — ED Provider Notes (Signed)
Medical screening examination/treatment/procedure(s) were conducted as a shared visit with non-physician practitioner(s) and myself.  I personally evaluated the patient during the encounter.  L ankle pain after twisting on ladder. Neurovascularly intact. Xray negative for acute fracture but does show possible chronic osteomyelitis. D/w patient's orthopedist Dr. Case. NP Shellia Carwin results with Dr. Romeo Apple who feels patient is stable for followup with his surgeon.  EKG Interpretation   None         Glynn Octave, MD 02/07/13 [phone]

## 2013-03-11 ENCOUNTER — Encounter (HOSPITAL_COMMUNITY): Payer: Self-pay | Admitting: Emergency Medicine

## 2013-03-11 ENCOUNTER — Emergency Department (HOSPITAL_COMMUNITY): Payer: Non-veteran care

## 2013-03-11 ENCOUNTER — Emergency Department (HOSPITAL_COMMUNITY)
Admission: EM | Admit: 2013-03-11 | Discharge: 2013-03-11 | Disposition: A | Discharge status: Home / Self Care | Payer: Non-veteran care | Attending: Emergency Medicine | Admitting: Emergency Medicine

## 2013-03-11 DIAGNOSIS — J4489 Other specified chronic obstructive pulmonary disease: Secondary | ICD-10-CM | POA: Insufficient documentation

## 2013-03-11 DIAGNOSIS — Z87891 Personal history of nicotine dependence: Secondary | ICD-10-CM | POA: Insufficient documentation

## 2013-03-11 DIAGNOSIS — I10 Essential (primary) hypertension: Secondary | ICD-10-CM | POA: Insufficient documentation

## 2013-03-11 DIAGNOSIS — J449 Chronic obstructive pulmonary disease, unspecified: Secondary | ICD-10-CM | POA: Insufficient documentation

## 2013-03-11 DIAGNOSIS — G8929 Other chronic pain: Secondary | ICD-10-CM | POA: Insufficient documentation

## 2013-03-11 DIAGNOSIS — Z9889 Other specified postprocedural states: Secondary | ICD-10-CM | POA: Insufficient documentation

## 2013-03-11 DIAGNOSIS — Z8619 Personal history of other infectious and parasitic diseases: Secondary | ICD-10-CM | POA: Insufficient documentation

## 2013-03-11 DIAGNOSIS — Z79899 Other long term (current) drug therapy: Secondary | ICD-10-CM | POA: Insufficient documentation

## 2013-03-11 DIAGNOSIS — M25579 Pain in unspecified ankle and joints of unspecified foot: Secondary | ICD-10-CM | POA: Insufficient documentation

## 2013-03-11 DIAGNOSIS — Z7982 Long term (current) use of aspirin: Secondary | ICD-10-CM | POA: Insufficient documentation

## 2013-03-11 MED ORDER — HYDROCODONE-ACETAMINOPHEN 5-325 MG PO TABS
1.0000 | ORAL_TABLET | Freq: Once | ORAL | Status: AC
  Administered 2013-03-11: 1 via ORAL
  Filled 2013-03-11: qty 1

## 2013-03-11 MED ORDER — HYDROCODONE-ACETAMINOPHEN 5-325 MG PO TABS: 1.0000 | ORAL_TABLET | ORAL | Status: DC | PRN

## 2013-03-11 NOTE — ED Notes (Signed)
Complain of left ankle burning from a past surgery. States there was some oozing from the outer aspect. None noted today

## 2013-03-13 NOTE — ED Provider Notes (Signed)
CSN: [id number]     Arrival date & time 03/11/13  0907 History   First MD Initiated Contact with Patient 03/11/13 [phone]     Chief Complaint  Patient presents with  . Ankle Pain   (Consider location/radiation/quality/duration/timing/severity/associated sxs/prior Treatment) HPI Comments: Victor Davidson is a 62 y.o. Male presenting for assistance with pain relief for his left ankle injury.  He was originally treated for tib fib fractures of this ankle x 2, most recent surgery over 1 year ago by Dr. Case in [place]. He was seen here one month ago at which time he had fallen from a ladder, twisting this ankle.  Xrays performed that day were negative for acute injury, but there was sclerosis along the distal fibular shaft an MRI was recommended to rule out chronic osteomyelitis.  In the interim,  Patient has opted to defer further treatment by Dr. Case and is anticipating an mri within the next 2 weeks through the [place] along with ortho f/u with them as well.  In the interim, he continues to have pain in the ankle.  He does report having a clear fluid leaking from the incision site last about 3 weeks ago, noticed after getting out of the shower, but none since.  He denies fevers or chills, there is no radiation of pain, and denies redness or swelling of the foot or ankle. He is currently taking tylenol without relief.     The history is provided by the patient and the spouse.    Past Medical History  Diagnosis Date  . Hypertension   . Herniated disc   . Hepatitis C   . COPD (chronic obstructive pulmonary disease)   . Chronic pain   . Chronic pain of left knee    Past Surgical History  Procedure Laterality Date  . Knee surgery    . Total hip arthroplasty    . Tonsillectomy    . Joint replacement    . Ankle surgery     Family History  Problem Relation Age of Onset  . Cancer Mother    History  Substance Use Topics  . Smoking status: Former Smoker -- 30 years    Quit date: 09/02/2012  .  Smokeless tobacco: Former Neurosurgeon    Types: Chew  . Alcohol Use: Yes     Comment: occassional    Review of Systems  Constitutional: Negative for fever and chills.  Musculoskeletal: Positive for arthralgias and joint swelling. Negative for myalgias.  Skin: Negative for color change and wound.  Neurological: Negative for weakness and numbness.    Allergies  Ketorolac tromethamine  Home Medications   Current Outpatient Rx  Name  Route  Sig  Dispense  Refill  . albuterol (PROVENTIL HFA;VENTOLIN HFA) 108 (90 BASE) MCG/ACT inhaler   Inhalation   Inhale 2 puffs into the lungs 2 (two) times daily as needed for wheezing or shortness of breath.         Marland Kitchen amLODipine (NORVASC) 10 MG tablet   Oral   Take 10 mg by mouth daily.          Marland Kitchen aspirin EC 81 MG tablet   Oral   Take 81 mg by mouth daily.         Marland Kitchen gabapentin (NEURONTIN) 300 MG capsule   Oral   Take 600 mg by mouth 3 (three) times daily. For nerve pain         . Multiple Vitamin (MULTIVITAMIN WITH MINERALS) TABS   Oral  Take 1 tablet by mouth daily.         . nicotine (NICODERM CQ - DOSED IN MG/24 HR) 7 mg/24hr patch   Transdermal   Place 1 patch onto the skin daily.         . propranolol (INDERAL) 20 MG tablet   Oral   Take 20 mg by mouth daily.         Marland Kitchen HYDROcodone-acetaminophen (NORCO/VICODIN) 5-325 MG per tablet   Oral   Take 1 tablet by mouth every 4 (four) hours as needed.   20 tablet   0    BP 142/87  Pulse 71  Temp(Src) 97.8 F (36.6 C) (Oral)  Resp 20  Ht 5\' 6"  (1.676 m)  Wt 160 lb (72.576 kg)  BMI 25.84 kg/m2  SpO2 99% Physical Exam  Constitutional: He appears well-developed and well-nourished.  HENT:  Head: Atraumatic.  Neck: Normal range of motion.  Cardiovascular:  Pulses equal bilaterally  Musculoskeletal: He exhibits tenderness. He exhibits no edema.       Left ankle: He exhibits no swelling, no ecchymosis, no laceration and normal pulse. Tenderness. Lateral malleolus  tenderness found. Achilles tendon normal.  Well healed lateral incision with no evidence of recent breakdown, no edema, erythema,  Pedal pulses intact. Tender.  Neurological: He is alert. He has normal strength. He displays normal reflexes. No sensory deficit.  Equal strength  Skin: Skin is warm and dry.  Psychiatric: He has a normal mood and affect.    ED Course  Procedures (including critical care time) Labs Review Labs Reviewed - No data to display Imaging Review No results found.  EKG Interpretation   None       MDM   1. Chronic ankle pain, left    Patients labs and/or radiological studies were viewed and considered during the medical decision making and disposition process.  xrays completed today. In comparison to one month ago, no change or progression of the found sclerosis. No pe exam findings to suggest acute infection or need for emergent mri.  [facility]  He was encouraged elevation, warm compresses.  He was given an aso to wear as he states his ankle sometimes feels "weak".  He left wearing this and states it felt much better.  He was prescribed hydrocodone.  He and wife at bedside agreeable with plan.    Burgess Amor, PA-C 03/13/13 1303

## 2013-03-16 NOTE — ED Provider Notes (Signed)
Medical screening examination/treatment/procedure(s) were performed by non-physician practitioner and as supervising physician I was immediately available for consultation/collaboration.  EKG Interpretation   None        Raeford Razor, MD 03/16/13 [phone]

## 2013-03-27 ENCOUNTER — Emergency Department (HOSPITAL_COMMUNITY)
Admission: EM | Admit: 2013-03-27 | Discharge: 2013-03-27 | Disposition: A | Discharge status: Home / Self Care | Payer: Non-veteran care | Attending: Emergency Medicine | Admitting: Emergency Medicine

## 2013-03-27 ENCOUNTER — Encounter (HOSPITAL_COMMUNITY): Payer: Self-pay | Admitting: Emergency Medicine

## 2013-03-27 DIAGNOSIS — Z8619 Personal history of other infectious and parasitic diseases: Secondary | ICD-10-CM | POA: Insufficient documentation

## 2013-03-27 DIAGNOSIS — Y929 Unspecified place or not applicable: Secondary | ICD-10-CM | POA: Insufficient documentation

## 2013-03-27 DIAGNOSIS — W010XXA Fall on same level from slipping, tripping and stumbling without subsequent striking against object, initial encounter: Secondary | ICD-10-CM | POA: Insufficient documentation

## 2013-03-27 DIAGNOSIS — Z87891 Personal history of nicotine dependence: Secondary | ICD-10-CM | POA: Insufficient documentation

## 2013-03-27 DIAGNOSIS — G8929 Other chronic pain: Secondary | ICD-10-CM | POA: Insufficient documentation

## 2013-03-27 DIAGNOSIS — S20229A Contusion of unspecified back wall of thorax, initial encounter: Secondary | ICD-10-CM | POA: Insufficient documentation

## 2013-03-27 DIAGNOSIS — Y9389 Activity, other specified: Secondary | ICD-10-CM | POA: Insufficient documentation

## 2013-03-27 DIAGNOSIS — R52 Pain, unspecified: Secondary | ICD-10-CM | POA: Insufficient documentation

## 2013-03-27 DIAGNOSIS — Z79899 Other long term (current) drug therapy: Secondary | ICD-10-CM | POA: Insufficient documentation

## 2013-03-27 DIAGNOSIS — Z8739 Personal history of other diseases of the musculoskeletal system and connective tissue: Secondary | ICD-10-CM | POA: Insufficient documentation

## 2013-03-27 DIAGNOSIS — S300XXA Contusion of lower back and pelvis, initial encounter: Secondary | ICD-10-CM

## 2013-03-27 DIAGNOSIS — I10 Essential (primary) hypertension: Secondary | ICD-10-CM | POA: Insufficient documentation

## 2013-03-27 DIAGNOSIS — Z7982 Long term (current) use of aspirin: Secondary | ICD-10-CM | POA: Insufficient documentation

## 2013-03-27 DIAGNOSIS — S8990XA Unspecified injury of unspecified lower leg, initial encounter: Secondary | ICD-10-CM | POA: Insufficient documentation

## 2013-03-27 DIAGNOSIS — J4489 Other specified chronic obstructive pulmonary disease: Secondary | ICD-10-CM | POA: Insufficient documentation

## 2013-03-27 DIAGNOSIS — J449 Chronic obstructive pulmonary disease, unspecified: Secondary | ICD-10-CM | POA: Insufficient documentation

## 2013-03-27 MED ORDER — OXYCODONE-ACETAMINOPHEN 5-325 MG PO TABS
1.0000 | ORAL_TABLET | Freq: Once | ORAL | Status: AC
  Administered 2013-03-27: 1 via ORAL
  Filled 2013-03-27: qty 1

## 2013-03-27 MED ORDER — HYDROCODONE-ACETAMINOPHEN 5-325 MG PO TABS: ORAL_TABLET | ORAL | Status: DC

## 2013-03-27 MED ORDER — METHOCARBAMOL 500 MG PO TABS: 500.0000 mg | ORAL_TABLET | Freq: Three times a day (TID) | ORAL | Status: DC

## 2013-03-27 NOTE — ED Notes (Signed)
Per patient fell yesterday cutting wood. Patient c/o lower back that radiates down left leg. Patient has hx of herniated and bulging discs.

## 2013-03-27 NOTE — ED Provider Notes (Signed)
CSN: [id number]     Arrival date & time 03/27/13  1739 History   First MD Initiated Contact with Patient 03/27/13 1758     Chief Complaint  Patient presents with  . Fall  . Back Pain   (Consider location/radiation/quality/duration/timing/severity/associated sxs/prior Treatment) Patient is a 62 y.o. male presenting with back pain. The history is provided by the patient.  Back Pain Location:  Lumbar spine Quality:  Aching Radiates to:  L thigh, L knee and L posterior upper leg Pain severity:  Moderate Pain is:  Same all the time Onset quality:  Gradual Duration:  1 day Timing:  Constant Progression:  Worsening (acute on chronic low back pain. worse for one day after he tripped over a piece of wood.  He denies fall.) Context: twisting   Context: not lifting heavy objects   Relieved by:  Nothing Worsened by:  Movement, twisting, bending and standing Ineffective treatments:  None tried Associated symptoms: leg pain   Associated symptoms: no abdominal pain, no bladder incontinence, no bowel incontinence, no chest pain, no dysuria, no fever, no headaches, no numbness, no paresthesias, no pelvic pain, no perianal numbness, no tingling and no weakness     Past Medical History  Diagnosis Date  . Hypertension   . Herniated disc   . Hepatitis C   . COPD (chronic obstructive pulmonary disease)   . Chronic pain   . Chronic pain of left knee    Past Surgical History  Procedure Laterality Date  . Knee surgery    . Total hip arthroplasty    . Tonsillectomy    . Joint replacement    . Ankle surgery     Family History  Problem Relation Age of Onset  . Cancer Mother    History  Substance Use Topics  . Smoking status: Former Smoker -- 30 years    Quit date: 09/02/2012  . Smokeless tobacco: Former Neurosurgeon    Types: Chew  . Alcohol Use: Yes     Comment: occassional    Review of Systems  Constitutional: Negative for fever.  Respiratory: Negative for shortness of breath.    Cardiovascular: Negative for chest pain.  Gastrointestinal: Negative for vomiting, abdominal pain, constipation and bowel incontinence.  Genitourinary: Negative for bladder incontinence, dysuria, hematuria, flank pain, decreased urine volume, difficulty urinating and pelvic pain.       No perineal numbness or incontinence of urine or feces  Musculoskeletal: Positive for back pain. Negative for joint swelling.  Skin: Negative for rash.  Neurological: Negative for tingling, weakness, numbness, headaches and paresthesias.  All other systems reviewed and are negative.    Allergies  Ketorolac tromethamine  Home Medications   Current Outpatient Rx  Name  Route  Sig  Dispense  Refill  . albuterol (PROVENTIL HFA;VENTOLIN HFA) 108 (90 BASE) MCG/ACT inhaler   Inhalation   Inhale 2 puffs into the lungs 2 (two) times daily as needed for wheezing or shortness of breath.         Marland Kitchen amLODipine (NORVASC) 10 MG tablet   Oral   Take 10 mg by mouth daily.          Marland Kitchen aspirin EC 81 MG tablet   Oral   Take 81 mg by mouth daily.         Marland Kitchen gabapentin (NEURONTIN) 300 MG capsule   Oral   Take 600 mg by mouth 3 (three) times daily. For nerve pain         . HYDROcodone-acetaminophen (NORCO/VICODIN)  5-325 MG per tablet   Oral   Take 1 tablet by mouth every 4 (four) hours as needed.   20 tablet   0   . Multiple Vitamin (MULTIVITAMIN WITH MINERALS) TABS   Oral   Take 1 tablet by mouth daily.         . nicotine (NICODERM CQ - DOSED IN MG/24 HR) 7 mg/24hr patch   Transdermal   Place 1 patch onto the skin daily.         . propranolol (INDERAL) 20 MG tablet   Oral   Take 20 mg by mouth daily.          BP 114/75  Pulse 68  Temp(Src) 98 F (36.7 C) (Oral)  Resp 18  Ht 5\' 6"  (1.676 m)  Wt 155 lb (70.308 kg)  BMI 25.03 kg/m2  SpO2 98% Physical Exam  Nursing note and vitals reviewed. Constitutional: He is oriented to person, place, and time. He appears well-developed and  well-nourished. No distress.  HENT:  Head: Normocephalic and atraumatic.  Neck: Normal range of motion. Neck supple.  Cardiovascular: Normal rate, regular rhythm, normal heart sounds and intact distal pulses.   No murmur heard. Pulmonary/Chest: Effort normal and breath sounds normal. No respiratory distress.  Abdominal: Soft. He exhibits no distension. There is no tenderness.  Musculoskeletal: He exhibits tenderness. He exhibits no edema.       Lumbar back: He exhibits tenderness and pain. He exhibits normal range of motion, no swelling, no deformity, no laceration and normal pulse.  ttp of the left lumbar paraspinal muscles and left SI joint space.  No spinal tenderness.  DP pulses are brisk and symmetrical.  Distal sensation intact.  Hip Flexors/Extensors are intact  Neurological: He is alert and oriented to person, place, and time. He has normal strength. No sensory deficit. He exhibits normal muscle tone. Coordination and gait normal.  Reflex Scores:      Patellar reflexes are 2+ on the right side and 2+ on the left side.      Achilles reflexes are 2+ on the right side and 2+ on the left side. Skin: Skin is warm and dry. No rash noted.    ED Course  Procedures (including critical care time) Labs Review Labs Reviewed - No data to display Imaging Review No results found.  EKG Interpretation   None       MDM    Patient reviewed on the [facility] narcotics database. No prescriptions since 03/11/2013.  Patient has ttp of the left lumbar paraspinal muscles and SI joint .  No focal neuro deficits on exam.  Ambulates with a steady gait.   Hx of chronic low back pain.  No concerning symptoms for emergent neurological or infectious process Patient declines x ray.  Agrees to symptomatic tx and close f/u his doctor.   Yi Haugan L. Trisha Mangle, PA-C 03/29/13 2103

## 2013-03-30 NOTE — ED Provider Notes (Signed)
Medical screening examination/treatment/procedure(s) were performed by non-physician practitioner and as supervising physician I was immediately available for consultation/collaboration.  EKG Interpretation   None         Iyla Balzarini L Brayant Dorr, MD 03/30/13 2316 

## 2013-05-08 ENCOUNTER — Encounter (HOSPITAL_COMMUNITY): Payer: Self-pay | Admitting: Emergency Medicine

## 2013-05-08 ENCOUNTER — Emergency Department (HOSPITAL_COMMUNITY): Payer: Non-veteran care

## 2013-05-08 ENCOUNTER — Emergency Department (HOSPITAL_COMMUNITY)
Admission: EM | Admit: 2013-05-08 | Discharge: 2013-05-08 | Disposition: A | Discharge status: Home / Self Care | Payer: Non-veteran care | Attending: Emergency Medicine | Admitting: Emergency Medicine

## 2013-05-08 DIAGNOSIS — J4489 Other specified chronic obstructive pulmonary disease: Secondary | ICD-10-CM | POA: Insufficient documentation

## 2013-05-08 DIAGNOSIS — M129 Arthropathy, unspecified: Secondary | ICD-10-CM | POA: Insufficient documentation

## 2013-05-08 DIAGNOSIS — J449 Chronic obstructive pulmonary disease, unspecified: Secondary | ICD-10-CM | POA: Insufficient documentation

## 2013-05-08 DIAGNOSIS — Y9339 Activity, other involving climbing, rappelling and jumping off: Secondary | ICD-10-CM | POA: Insufficient documentation

## 2013-05-08 DIAGNOSIS — Z79899 Other long term (current) drug therapy: Secondary | ICD-10-CM | POA: Insufficient documentation

## 2013-05-08 DIAGNOSIS — W010XXA Fall on same level from slipping, tripping and stumbling without subsequent striking against object, initial encounter: Secondary | ICD-10-CM | POA: Insufficient documentation

## 2013-05-08 DIAGNOSIS — IMO0002 Reserved for concepts with insufficient information to code with codable children: Secondary | ICD-10-CM | POA: Insufficient documentation

## 2013-05-08 DIAGNOSIS — Z87891 Personal history of nicotine dependence: Secondary | ICD-10-CM | POA: Insufficient documentation

## 2013-05-08 DIAGNOSIS — Y929 Unspecified place or not applicable: Secondary | ICD-10-CM | POA: Insufficient documentation

## 2013-05-08 DIAGNOSIS — Z9889 Other specified postprocedural states: Secondary | ICD-10-CM | POA: Insufficient documentation

## 2013-05-08 DIAGNOSIS — I10 Essential (primary) hypertension: Secondary | ICD-10-CM | POA: Insufficient documentation

## 2013-05-08 DIAGNOSIS — S86912A Strain of unspecified muscle(s) and tendon(s) at lower leg level, left leg, initial encounter: Secondary | ICD-10-CM

## 2013-05-08 DIAGNOSIS — Z8619 Personal history of other infectious and parasitic diseases: Secondary | ICD-10-CM | POA: Insufficient documentation

## 2013-05-08 DIAGNOSIS — M1990 Unspecified osteoarthritis, unspecified site: Secondary | ICD-10-CM

## 2013-05-08 DIAGNOSIS — G8929 Other chronic pain: Secondary | ICD-10-CM | POA: Insufficient documentation

## 2013-05-08 MED ORDER — DEXAMETHASONE 6 MG PO TABS: ORAL_TABLET | ORAL | Status: DC

## 2013-05-08 MED ORDER — HYDROCODONE-ACETAMINOPHEN 5-325 MG PO TABS: 1.0000 | ORAL_TABLET | ORAL | Status: DC | PRN

## 2013-05-08 NOTE — ED Notes (Signed)
Pt c/o left knee and left ankle pain after jumping off the back of a truck and slipping on ice Friday while getting up wood, cms intact distal.

## 2013-05-08 NOTE — ED Provider Notes (Signed)
Medical screening examination/treatment/procedure(s) were performed by non-physician practitioner and as supervising physician I was immediately available for consultation/collaboration.  EKG Interpretation   None         Glynn OctaveStephen Katiria Calame, MD 05/08/13 [phone]

## 2013-05-08 NOTE — ED Notes (Signed)
Patient with no complaints at this time. Respirations even and unlabored. Skin warm/dry. Discharge instructions reviewed with patient at this time. Patient given opportunity to voice concerns/ask questions. Patient discharged at this time and left Emergency Department with steady gait.   

## 2013-05-08 NOTE — ED Provider Notes (Signed)
CSN: [id number]     Arrival date & time 05/08/13  4098 History   First MD Initiated Contact with Patient 05/08/13 1010     Chief Complaint  Patient presents with  . Knee Pain   (Consider location/radiation/quality/duration/timing/severity/associated sxs/prior Treatment) HPI Comments: Patient is a 63 year old male who presents to the emergency department with complaint of left knee and ankle pain. The patient states that he jumped off the back of a truck, slipped on some sleep and ice, and injured the left knee and ankle on Friday, January 23. The patient continues to have some pain, particularly when he is standing on it a lot at his job, and presents to the emergency department for evaluation and assistance with this pain. It is of note that the patient has chronic pain of the left knee and ankle. He has had surgery on the knee, including surgical removal of the patella following an accident during the [place] War. Patient is primarily seen by the physicians at the [facility]. Patient states that he could not get it to be seen, and request assistance here at the emergency department.  Patient is a 63 y.o. male presenting with knee pain. The history is provided by the patient.  Knee Pain Associated symptoms: back pain   Associated symptoms: no neck pain     Past Medical History  Diagnosis Date  . Hypertension   . Herniated disc   . Hepatitis C   . COPD (chronic obstructive pulmonary disease)   . Chronic pain   . Chronic pain of left knee    Past Surgical History  Procedure Laterality Date  . Knee surgery    . Total hip arthroplasty    . Tonsillectomy    . Joint replacement    . Ankle surgery     Family History  Problem Relation Age of Onset  . Cancer Mother    History  Substance Use Topics  . Smoking status: Former Smoker -- 30 years    Quit date: 09/02/2012  . Smokeless tobacco: Former Neurosurgeon    Types: Chew  . Alcohol Use: Yes     Comment: occassional     Review of Systems  Constitutional: Negative for activity change.       All ROS Neg except as noted in HPI  HENT: Negative for nosebleeds.   Eyes: Negative for photophobia and discharge.  Respiratory: Negative for cough, shortness of breath and wheezing.   Cardiovascular: Negative for chest pain and palpitations.  Gastrointestinal: Negative for abdominal pain and blood in stool.  Genitourinary: Negative for dysuria, frequency and hematuria.  Musculoskeletal: Positive for arthralgias, back pain and gait problem. Negative for neck pain.  Skin: Negative.   Neurological: Negative for dizziness, seizures and speech difficulty.  Psychiatric/Behavioral: Negative for hallucinations and confusion.    Allergies  Ketorolac tromethamine  Home Medications   Current Outpatient Rx  Name  Route  Sig  Dispense  Refill  . albuterol (PROVENTIL HFA;VENTOLIN HFA) 108 (90 BASE) MCG/ACT inhaler   Inhalation   Inhale 2 puffs into the lungs 2 (two) times daily as needed for wheezing or shortness of breath.         Marland Kitchen amLODipine (NORVASC) 10 MG tablet   Oral   Take 10 mg by mouth daily.          Marland Kitchen gabapentin (NEURONTIN) 300 MG capsule   Oral   Take 600 mg by mouth 3 (three) times daily. For nerve pain         .  Multiple Vitamin (MULTIVITAMIN WITH MINERALS) TABS   Oral   Take 1 tablet by mouth daily.         . nicotine (NICODERM CQ - DOSED IN MG/24 HR) 7 mg/24hr patch   Transdermal   Place 1 patch onto the skin daily.         . promethazine (PHENERGAN) 25 MG tablet   Oral   Take 25 mg by mouth every 6 (six) hours as needed for nausea or vomiting.         . propranolol (INDERAL) 20 MG tablet   Oral   Take 20 mg by mouth daily.         Marland Kitchen. dexamethasone (DECADRON) 6 MG tablet      1 po bid with food   12 tablet   0   . HYDROcodone-acetaminophen (NORCO) 5-325 MG per tablet   Oral   Take 1 tablet by mouth every 4 (four) hours as needed for moderate pain.   15 tablet   0     BP 139/84  Pulse 70  Temp(Src) 98.2 F (36.8 C) (Oral)  Resp 20  Ht 5\' 6"  (1.676 m)  Wt 155 lb (70.308 kg)  BMI 25.03 kg/m2  SpO2 97% Physical Exam  Nursing note and vitals reviewed. Constitutional: He is oriented to person, place, and time. He appears well-developed and well-nourished.  Non-toxic appearance.  HENT:  Head: Normocephalic.  Right Ear: Tympanic membrane and external ear normal.  Left Ear: Tympanic membrane and external ear normal.  Eyes: EOM and lids are normal. Pupils are equal, round, and reactive to light.  Neck: Normal range of motion. Neck supple. Carotid bruit is not present.  Cardiovascular: Normal rate, regular rhythm, normal heart sounds, intact distal pulses and normal pulses.   Pulmonary/Chest: Breath sounds normal. No respiratory distress.  Abdominal: Soft. Bowel sounds are normal. There is no tenderness. There is no guarding.  Musculoskeletal: Normal range of motion.  There is good range of motion of the left hip. There is a well healed surgical scar of the anterior left knee. There is crepitus, and limited range of motion of the left knee. I cannot demonstrate an effusion. There is no posterior mass appreciated. There's not any hot areas concerning the knee.  There is good range of motion of the left ankle. There no hot areas appreciated, neither is there swelling or effusion noted. Is full range of motion of the toes of the left foot. The Achilles tendon is intact.  Lymphadenopathy:       Head (right side): No submandibular adenopathy present.       Head (left side): No submandibular adenopathy present.    He has no cervical adenopathy.  Neurological: He is alert and oriented to person, place, and time. He has normal strength. No cranial nerve deficit or sensory deficit.  Skin: Skin is warm and dry.  Psychiatric: He has a normal mood and affect. His speech is normal.    ED Course  Procedures (including critical care time) Labs Review Labs Reviewed  - No data to display Imaging Review Dg Knee Complete 4 Views Left  05/08/2013   CLINICAL DATA:  Fall  EXAM: LEFT KNEE - COMPLETE 4+ VIEW  COMPARISON:  05/23/2012  FINDINGS: No acute fracture. No dislocation. 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  IMPRESSION: No acute bony pathology.  Chronic changes.   Electronically Signed   By: Maryclare BeanArt  Hoss M.D.   On: 05/08/2013 10:37  EKG Interpretation   None       MDM   1. Strain of left knee   2. DJD (degenerative joint disease)    I have reviewed nursing notes, vital signs, and all appropriate lab and imaging results for this patient.**  X-ray of the left knee reveals chronic changes. 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 No other acute problems appreciated.  Patient advised of the x-ray findings. Patient advised to see his physicians at the [facility] for followup and recheck. Also for management of his discomfort. Patient treated with prescription for Decadron and Norco.  Kathie Dike, PA-C 05/08/13 1133

## 2013-05-08 NOTE — Discharge Instructions (Signed)
The x-ray of your knee is negative for fracture or dislocation. It does reveal degenerative joint disease changes. Please use Decadron 2 times daily with food, may use Norco for pain if needed. This medication may cause drowsiness, please use with caution. [facility] for evaluation of her pain. Please also see them for any additional pain control needs. Arthritis, Nonspecific Arthritis is pain, redness, warmth, or puffiness (inflammation) of a joint. The joint may be stiff or hurt when you move it. One or more joints may be affected. There are many types of arthritis. Your doctor may not know what type you have right away. The most common cause of arthritis is wear and tear on the joint (osteoarthritis). HOME CARE   Only take medicine as told by your doctor.  Rest the joint as much as possible.  Raise (elevate) your joint if it is puffy.  Use crutches if the painful joint is in your leg.  Drink enough fluids to keep your pee (urine) clear or pale yellow.  Follow your doctor's diet instructions.  Use cold packs for very bad joint pain for 10 to 15 minutes every hour. Ask your doctor if it is okay for you to use hot packs.  Exercise as told by your doctor.  Take a warm shower if you have stiffness in the morning.  Move your sore joints throughout the day. GET HELP RIGHT AWAY IF:   You have a fever.  You have very bad joint pain, puffiness, or redness.  You have many joints that are painful and puffy.  You are not getting better with treatment.  You have very bad back pain or leg weakness.  You cannot control when you poop (bowel movement) or pee (urinate).  You do not feel better in 24 hours or are getting worse.  You are having side effects from your medicine. MAKE SURE YOU:   Understand these instructions.  Will watch your condition.  Will get help right away if you are not doing well or get worse. Document  Released: 06/25/2009 Document Revised: 09/30/2011 Document Reviewed: 06/25/2009 [facility] Patient Information 2014 [employer] [place].

## 2013-05-08 NOTE — ED Notes (Signed)
C/o left medial knee pain. Twisting injury yesterday. Previous surgery on [place] due [place]. Patient ambulatory with cane.

## 2013-06-01 ENCOUNTER — Emergency Department (HOSPITAL_COMMUNITY): Payer: Non-veteran care

## 2013-06-01 ENCOUNTER — Emergency Department (HOSPITAL_COMMUNITY)
Admission: EM | Admit: 2013-06-01 | Discharge: 2013-06-01 | Disposition: A | Discharge status: Home / Self Care | Payer: Non-veteran care | Attending: Emergency Medicine | Admitting: Emergency Medicine

## 2013-06-01 ENCOUNTER — Encounter (HOSPITAL_COMMUNITY): Payer: Self-pay | Admitting: Emergency Medicine

## 2013-06-01 DIAGNOSIS — Y9389 Activity, other specified: Secondary | ICD-10-CM | POA: Insufficient documentation

## 2013-06-01 DIAGNOSIS — IMO0002 Reserved for concepts with insufficient information to code with codable children: Secondary | ICD-10-CM | POA: Insufficient documentation

## 2013-06-01 DIAGNOSIS — W010XXA Fall on same level from slipping, tripping and stumbling without subsequent striking against object, initial encounter: Secondary | ICD-10-CM | POA: Insufficient documentation

## 2013-06-01 DIAGNOSIS — Z79899 Other long term (current) drug therapy: Secondary | ICD-10-CM | POA: Insufficient documentation

## 2013-06-01 DIAGNOSIS — M545 Low back pain, unspecified: Secondary | ICD-10-CM | POA: Insufficient documentation

## 2013-06-01 DIAGNOSIS — M25569 Pain in unspecified knee: Secondary | ICD-10-CM | POA: Insufficient documentation

## 2013-06-01 DIAGNOSIS — Z87891 Personal history of nicotine dependence: Secondary | ICD-10-CM | POA: Insufficient documentation

## 2013-06-01 DIAGNOSIS — Z8619 Personal history of other infectious and parasitic diseases: Secondary | ICD-10-CM | POA: Insufficient documentation

## 2013-06-01 DIAGNOSIS — G8929 Other chronic pain: Secondary | ICD-10-CM | POA: Insufficient documentation

## 2013-06-01 DIAGNOSIS — J4489 Other specified chronic obstructive pulmonary disease: Secondary | ICD-10-CM | POA: Insufficient documentation

## 2013-06-01 DIAGNOSIS — S2239XA Fracture of one rib, unspecified side, initial encounter for closed fracture: Secondary | ICD-10-CM

## 2013-06-01 DIAGNOSIS — Y929 Unspecified place or not applicable: Secondary | ICD-10-CM | POA: Insufficient documentation

## 2013-06-01 DIAGNOSIS — J449 Chronic obstructive pulmonary disease, unspecified: Secondary | ICD-10-CM | POA: Insufficient documentation

## 2013-06-01 DIAGNOSIS — I10 Essential (primary) hypertension: Secondary | ICD-10-CM | POA: Insufficient documentation

## 2013-06-01 DIAGNOSIS — S2249XA Multiple fractures of ribs, unspecified side, initial encounter for closed fracture: Secondary | ICD-10-CM | POA: Insufficient documentation

## 2013-06-01 MED ORDER — HYDROCODONE-ACETAMINOPHEN 5-325 MG PO TABS
2.0000 | ORAL_TABLET | Freq: Once | ORAL | Status: AC
  Administered 2013-06-01: 2 via ORAL
  Filled 2013-06-01: qty 2

## 2013-06-01 MED ORDER — OXYCODONE-ACETAMINOPHEN 5-325 MG PO TABS: 2.0000 | ORAL_TABLET | Freq: Four times a day (QID) | ORAL | Status: DC | PRN

## 2013-06-01 NOTE — ED Provider Notes (Signed)
CSN: [id number]     Arrival date & time 06/01/13  1152 History   First MD Initiated Contact with Patient 06/01/13 1214     Chief Complaint  Patient presents with  . Fall     (Consider location/radiation/quality/duration/timing/severity/associated sxs/prior Treatment) Patient is a 63 y.o. male presenting with chest pain. The history is provided by the patient.  Chest Pain Pain location:  L chest Pain radiates to:  Does not radiate Pain radiates to the back: no   Pain severity:  Moderate Context: breathing   Associated symptoms: no cough, no fever, no orthopnea, no shortness of breath, no syncope and no weakness     Past Medical History  Diagnosis Date  . Hypertension   . Herniated disc   . Hepatitis C   . COPD (chronic obstructive pulmonary disease)   . Chronic pain   . Chronic pain of left knee    Past Surgical History  Procedure Laterality Date  . Knee surgery    . Total hip arthroplasty    . Tonsillectomy    . Joint replacement    . Ankle surgery     Family History  Problem Relation Age of Onset  . Cancer Mother    History  Substance Use Topics  . Smoking status: Former Smoker -- 30 years    Quit date: 09/02/2012  . Smokeless tobacco: Former Neurosurgeon    Types: Chew  . Alcohol Use: Yes     Comment: occassional    Review of Systems  Constitutional: Negative for fever.  Respiratory: Negative for cough and shortness of breath.   Cardiovascular: Positive for chest pain. Negative for orthopnea and syncope.  Neurological: Negative for weakness.      Allergies  Ketorolac tromethamine  Home Medications   Current Outpatient Rx  Name  Route  Sig  Dispense  Refill  . albuterol (PROVENTIL HFA;VENTOLIN HFA) 108 (90 BASE) MCG/ACT inhaler   Inhalation   Inhale 2 puffs into the lungs 2 (two) times daily as needed for wheezing or shortness of breath.         Marland Kitchen amLODipine (NORVASC) 10 MG tablet   Oral   Take 10 mg by mouth daily.          Marland Kitchen dexamethasone  (DECADRON) 6 MG tablet      1 po bid with food   12 tablet   0   . gabapentin (NEURONTIN) 300 MG capsule   Oral   Take 600 mg by mouth 3 (three) times daily. For nerve pain         . HYDROcodone-acetaminophen (NORCO) 5-325 MG per tablet   Oral   Take 1 tablet by mouth every 4 (four) hours as needed for moderate pain.   15 tablet   0   . Multiple Vitamin (MULTIVITAMIN WITH MINERALS) TABS   Oral   Take 1 tablet by mouth daily.         . nicotine (NICODERM CQ - DOSED IN MG/24 HR) 7 mg/24hr patch   Transdermal   Place 1 patch onto the skin daily.         . promethazine (PHENERGAN) 25 MG tablet   Oral   Take 25 mg by mouth every 6 (six) hours as needed for nausea or vomiting.         . propranolol (INDERAL) 20 MG tablet   Oral   Take 20 mg by mouth daily.          BP 158/85  Pulse 78  Temp(Src) 97.8 F (36.6 C) (Oral)  Resp 18  Ht 5\' 6"  (1.676 m)  Wt 150 lb (68.04 kg)  BMI 24.22 kg/m2  SpO2 100% Physical Exam  Nursing note and vitals reviewed. Constitutional: He is oriented to person, place, and time. He appears well-developed and well-nourished. No distress.  Eyes: Conjunctivae and EOM are normal.  Neck: Normal range of motion. Neck supple.  Pulmonary/Chest: Effort normal and breath sounds normal.  Abrasions and ecchymosis to left chest wall. No deformity or crepitus. Pt reports pain with deep breathing.   Musculoskeletal: Normal range of motion.  Paravertebral tenderness in lower lumbar region. No numbness or tingling. Good ROM, strength and coordination. Left knee pain, no edema or erythema noted. Good ROM of left knee. Distal pulses and sensation intact.  Neurological: He is alert and oriented to person, place, and time.  Skin: Skin is warm and dry.  Psychiatric: He has a normal mood and affect. His behavior is normal. Judgment and thought content normal.    ED Course  Procedures (including critical care time) Labs Review Labs Reviewed - No data to  display Imaging Review No results found.  EKG Interpretation   None       MDM   Final diagnoses:  Rib fractures    Left sided rib fractures; fourth, fifth, sixth, seventh and eighth ribs. Radiology read may be that seventh and eighth ribs are acute and the others may be old. No pneumo, no hemothorax. Breath sounds clear bilaterally, pt reports pain with deep breathing.Left knee x-ray negative.  Hydrocodone given here in ER with some relief. Percocet prescription given and pt instructed to deep breath. Return precautions given and pt agrees to treatment plan.       Irish EldersKelly Jonty Morrical, NP 06/05/13 2157

## 2013-06-01 NOTE — Discharge Instructions (Signed)
Rib Fracture A rib fracture is a break or crack in one of the bones of the ribs. The ribs are a group of long, curved bones that wrap around your chest and attach to your spine. They protect your lungs and other organs in the chest cavity. A broken or cracked rib is often painful, but most do not cause other problems. Most rib fractures heal on their own over time. However, rib fractures can be more serious if multiple ribs are broken or if broken ribs move out of place and push against other structures. CAUSES   A direct blow to the chest. For example, this could happen during contact sports, a car accident, or a fall against a hard object.  Repetitive movements with high force, such as pitching a baseball or having severe coughing spells. SYMPTOMS   Pain when you breathe in or cough.  Pain when someone presses on the injured area. DIAGNOSIS  Your caregiver will perform a physical exam. Various imaging tests may be ordered to confirm the diagnosis and to look for related injuries. These tests may include a chest X-ray, computed tomography (CT), magnetic resonance imaging (MRI), or a bone scan. TREATMENT  Rib fractures usually heal on their own in 1 3 months. The longer healing period is often associated with a continued cough or other aggravating activities. During the healing period, pain control is very important. Medication is usually given to control pain. Hospitalization or surgery may be needed for more severe injuries, such as those in which multiple ribs are broken or the ribs have moved out of place.  HOME CARE INSTRUCTIONS   Avoid strenuous activity and any activities or movements that cause pain. Be careful during activities and avoid bumping the injured rib.  Gradually increase activity as directed by your caregiver.  Only take over-the-counter or prescription medications as directed by your caregiver. Do not take other medications without asking your caregiver first.  Apply ice  to the injured area for the first 1 2 days after you have been treated or as directed by your caregiver. Applying ice helps to reduce inflammation and pain.  Put ice in a plastic bag.  Place a towel between your skin and the bag.   Leave the ice on for 15 20 minutes at a time, every 2 hours while you are awake.  Perform deep breathing as directed by your caregiver. This will help prevent pneumonia, which is a common complication of a broken rib. Your caregiver may instruct you to:  Take deep breaths several times a day.  Try to cough several times a day, holding a pillow against the injured area.  Use a device called an incentive spirometer to practice deep breathing several times a day.  Drink enough fluids to keep your urine clear or pale yellow. This will help you avoid constipation.   Do not wear a rib belt or binder. These restrict breathing, which can lead to pneumonia.  SEEK IMMEDIATE MEDICAL CARE IF:   You have a fever.   You have difficulty breathing or shortness of breath.   You develop a continual cough, or you cough up thick or bloody sputum.  You feel sick to your stomach (nausea), throw up (vomit), or have abdominal pain.   You have worsening pain not controlled with medications.  MAKE SURE YOU:  Understand these instructions.  Will watch your condition.  Will get help right away if you are not doing well or get worse. Document Released: 03/31/2005 Document Revised:  12/01/2012 Document Reviewed: 06/02/2012 Exit[employer] Patient Information 2014 [employer] [place].  Deep breathing every two hours Return for shortness of breath, difficulty breathing or increased pain or fever Percocet as prescribed for pani

## 2013-06-01 NOTE — ED Notes (Signed)
Pt reports slipped on ice yesterday evening.  C/O pain to lower back, left ribs, and left knee.  Denies any head or neck pain.  Denies any LOC.  Denies any SOB.

## 2013-06-04 ENCOUNTER — Emergency Department (HOSPITAL_COMMUNITY): Payer: Non-veteran care

## 2013-06-04 ENCOUNTER — Emergency Department (HOSPITAL_COMMUNITY)
Admission: EM | Admit: 2013-06-04 | Discharge: 2013-06-04 | Disposition: A | Discharge status: Home / Self Care | Payer: Non-veteran care | Attending: Emergency Medicine | Admitting: Emergency Medicine

## 2013-06-04 ENCOUNTER — Encounter (HOSPITAL_COMMUNITY): Payer: Self-pay | Admitting: Emergency Medicine

## 2013-06-04 DIAGNOSIS — Z8619 Personal history of other infectious and parasitic diseases: Secondary | ICD-10-CM | POA: Insufficient documentation

## 2013-06-04 DIAGNOSIS — S2249XA Multiple fractures of ribs, unspecified side, initial encounter for closed fracture: Secondary | ICD-10-CM

## 2013-06-04 DIAGNOSIS — J449 Chronic obstructive pulmonary disease, unspecified: Secondary | ICD-10-CM | POA: Insufficient documentation

## 2013-06-04 DIAGNOSIS — I10 Essential (primary) hypertension: Secondary | ICD-10-CM | POA: Insufficient documentation

## 2013-06-04 DIAGNOSIS — R059 Cough, unspecified: Secondary | ICD-10-CM | POA: Insufficient documentation

## 2013-06-04 DIAGNOSIS — R05 Cough: Secondary | ICD-10-CM | POA: Insufficient documentation

## 2013-06-04 DIAGNOSIS — IMO0001 Reserved for inherently not codable concepts without codable children: Secondary | ICD-10-CM | POA: Insufficient documentation

## 2013-06-04 DIAGNOSIS — Z87891 Personal history of nicotine dependence: Secondary | ICD-10-CM | POA: Insufficient documentation

## 2013-06-04 DIAGNOSIS — Z79899 Other long term (current) drug therapy: Secondary | ICD-10-CM | POA: Insufficient documentation

## 2013-06-04 DIAGNOSIS — G8929 Other chronic pain: Secondary | ICD-10-CM | POA: Insufficient documentation

## 2013-06-04 DIAGNOSIS — Z8739 Personal history of other diseases of the musculoskeletal system and connective tissue: Secondary | ICD-10-CM | POA: Insufficient documentation

## 2013-06-04 DIAGNOSIS — J4489 Other specified chronic obstructive pulmonary disease: Secondary | ICD-10-CM | POA: Insufficient documentation

## 2013-06-04 MED ORDER — OXYCODONE-ACETAMINOPHEN 5-325 MG PO TABS: 2.0000 | ORAL_TABLET | Freq: Four times a day (QID) | ORAL | Status: DC | PRN

## 2013-06-04 MED ORDER — OXYCODONE-ACETAMINOPHEN 5-325 MG PO TABS: 2.0000 | ORAL_TABLET | ORAL | Status: DC | PRN

## 2013-06-04 NOTE — ED Provider Notes (Signed)
CSN: [id number]     Arrival date & time 06/04/13  7253 History  This chart was scribed for Audree Camel, MD by Quintella Reichert, ED scribe.  This patient was seen in room APA19/[facility]19 and the patient's care was started at 9:31 AM.   Chief Complaint  Patient presents with  . pain from rib fracture     The history is provided by the patient. No language interpreter was used.    HPI Comments: Victor Davidson is a 63 y.o. male with h/o COPD who presents to the Emergency Department complaining of left-sided rib pain.  Pt was seen here 3 days ago for left-sided rib pain subsequent to falling the prior night, and diagnosed with 5 fractured ribs based on CXR.  He was advised to do deep breathing exercises at home but states he cannot tolerate taking deep breaths due to his pain.  He has been taking his percocet as instructed every 6 hours, but it only allows him to breathe without pain for 1-2 hours before his pain returns.  He reports he has continued to have left-sided rib pain which has not improved.  He came to the ED today over concern that his pain is not improving, and he is also concerned over the possibility of pneumonia due to being unable to breathe deep or cough without pain.  He denies SOB or abdominal pain.  Pt states he has been hospitalized in the past for pneumonia and he does not think he has pneumonia currently.   Past Medical History  Diagnosis Date  . Hypertension   . Herniated disc   . Hepatitis C   . COPD (chronic obstructive pulmonary disease)   . Chronic pain   . Chronic pain of left knee     Past Surgical History  Procedure Laterality Date  . Knee surgery    . Total hip arthroplasty    . Tonsillectomy    . Joint replacement    . Ankle surgery      Family History  Problem Relation Age of Onset  . Cancer Mother     History  Substance Use Topics  . Smoking status: Former Smoker -- 30 years    Quit date: 09/02/2012  . Smokeless tobacco: Former Neurosurgeon     Types: Chew  . Alcohol Use: Yes     Comment: occassional     Review of Systems  Constitutional: Negative for fever.  Respiratory: Positive for cough. Negative for shortness of breath.   Cardiovascular: Positive for chest pain (left-sided rib pain).  Gastrointestinal: Negative for vomiting and abdominal pain.  All other systems reviewed and are negative.      Allergies  Ketorolac tromethamine  Home Medications   Current Outpatient Rx  Name  Route  Sig  Dispense  Refill  . albuterol (PROVENTIL HFA;VENTOLIN HFA) 108 (90 BASE) MCG/ACT inhaler   Inhalation   Inhale 2 puffs into the lungs 2 (two) times daily as needed for wheezing or shortness of breath.         Marland Kitchen amLODipine (NORVASC) 10 MG tablet   Oral   Take 10 mg by mouth daily.          Marland Kitchen gabapentin (NEURONTIN) 300 MG capsule   Oral   Take 600 mg by mouth 3 (three) times daily. For nerve pain         . Multiple Vitamin (MULTIVITAMIN WITH MINERALS) TABS   Oral   Take 1 tablet by mouth daily.         Marland Kitchen  nicotine (NICODERM CQ - DOSED IN MG/24 HR) 7 mg/24hr patch   Transdermal   Place 1 patch onto the skin daily.         Marland Kitchen. oxyCODONE-acetaminophen (PERCOCET/ROXICET) 5-325 MG per tablet   Oral   Take 2 tablets by mouth every 6 (six) hours as needed for severe pain.   30 tablet   0   . promethazine (PHENERGAN) 25 MG tablet   Oral   Take 25 mg by mouth every 6 (six) hours as needed for nausea or vomiting.         . propranolol (INDERAL) 20 MG tablet   Oral   Take 20 mg by mouth daily.          BP 138/92  Pulse 80  Temp(Src) 97.9 F (36.6 C) (Oral)  Resp 18  SpO2 100%  Physical Exam  Nursing note and vitals reviewed. Constitutional: He is oriented to person, place, and time. He appears well-developed and well-nourished. No distress.  HENT:  Head: Normocephalic and atraumatic.  Neck: Neck supple. No tracheal deviation present.  Cardiovascular: Normal rate, regular rhythm and normal heart  sounds.   Pulmonary/Chest: Effort normal and breath sounds normal. No respiratory distress. He has no wheezes. He has no rales. He exhibits tenderness (Ecchymosis and tenderness to left mid-axillary flank and back).  Abdominal: He exhibits no distension. There is no tenderness.  Musculoskeletal: Normal range of motion.  Neurological: He is alert and oriented to person, place, and time.  Skin: Skin is warm and dry.  Psychiatric: He has a normal mood and affect. His behavior is normal.    ED Course  Procedures (including critical care time)  DIAGNOSTIC STUDIES: Oxygen Saturation is 100% on room air, normal by my interpretation.    COORDINATION OF CARE: 9:39 AM-Discussed treatment plan which includes CXR with pt at bedside and pt agreed to plan.     Labs Review Labs Reviewed - No data to display  Imaging Review Dg Chest 2 View  06/04/2013   CLINICAL DATA:  Cough, recent rib fractures, rule out pneumonia.  EXAM: CHEST  2 VIEW  COMPARISON:  06/01/2013 rib radiographs  FINDINGS: The cardiac silhouette is within normal limits for size. Thoracic aorta is mildly tortuous. The lungs are mildly hyperinflated without evidence of airspace consolidation, edema, pleural effusion, or pneumothorax. Multiple old right rib fractures are again seen. Multiple left-sided rib fractures are also noted, better evaluated on recent dedicated rib series.  IMPRESSION: No evidence of acute airspace disease.   Electronically Signed   By: Sebastian AcheAllen  Grady   On: 06/04/2013 10:19    EKG Interpretation   None       MDM   Final diagnoses:  Multiple rib fractures    Patient feels his pain is temporarily controlled for a few hours after percocet but after that has trouble with deep inspiratory exercises. No PNA, hypoxia. As long as he has Percocet he feels like his pain is controlled. He is almost out of Percocet, will give a new prescription. Is taking this every 6, I feel he is able to take him every 4. I cautioned  patient that this could make him more drowsy. Patient states he understands. Not given pain medication the ER due to patient driving. He was given and so this brought her shown how to use it and cautioned to followup closely with his PCP.    I personally performed the services described in this documentation, which was scribed in my presence. The recorded  information has been reviewed and is accurate.   Audree Camel, MD 06/04/13 [phone]

## 2013-06-04 NOTE — ED Notes (Signed)
Pt reports was evaluated here Wednesday and was diagnosed with left rib fracture.  Pt says was told to do deep breathing exercises at home but pt says can not tolerate taking deep breaths.  Reports started having some chest congestion yesterday and productive cough with yellow sputum.  Denies SOB, but says hurts to breathe.

## 2013-06-04 NOTE — Discharge Instructions (Signed)
Rib Fracture  A rib fracture is a break or crack in one of the bones of the ribs. The ribs are a group of long, curved bones that wrap around your chest and attach to your spine. They protect your lungs and other organs in the chest cavity. A broken or cracked rib is often painful, but most do not cause other problems. Most rib fractures heal on their own over time. However, rib fractures can be more serious if multiple ribs are broken or if broken ribs move out of place and push against other structures.  CAUSES   · A direct blow to the chest. For example, this could happen during contact sports, a car accident, or a fall against a hard object.  · Repetitive movements with high force, such as pitching a baseball or having severe coughing spells.  SYMPTOMS   · Pain when you breathe in or cough.  · Pain when someone presses on the injured area.  DIAGNOSIS   Your caregiver will perform a physical exam. Various imaging tests may be ordered to confirm the diagnosis and to look for related injuries. These tests may include a chest X-ray, computed tomography (CT), magnetic resonance imaging (MRI), or a bone scan.  TREATMENT   Rib fractures usually heal on their own in 1 3 months. The longer healing period is often associated with a continued cough or other aggravating activities. During the healing period, pain control is very important. Medication is usually given to control pain. Hospitalization or surgery may be needed for more severe injuries, such as those in which multiple ribs are broken or the ribs have moved out of place.   HOME CARE INSTRUCTIONS   · Avoid strenuous activity and any activities or movements that cause pain. Be careful during activities and avoid bumping the injured rib.  · Gradually increase activity as directed by your caregiver.  · Only take over-the-counter or prescription medications as directed by your caregiver. Do not take other medications without asking your caregiver first.  · Apply ice  to the injured area for the first 1 2 days after you have been treated or as directed by your caregiver. Applying ice helps to reduce inflammation and pain.  · Put ice in a plastic bag.  · Place a towel between your skin and the bag.    · Leave the ice on for 15 20 minutes at a time, every 2 hours while you are awake.  · Perform deep breathing as directed by your caregiver. This will help prevent pneumonia, which is a common complication of a broken rib. Your caregiver may instruct you to:  · Take deep breaths several times a day.  · Try to cough several times a day, holding a pillow against the injured area.  · Use a device called an incentive spirometer to practice deep breathing several times a day.  · Drink enough fluids to keep your urine clear or pale yellow. This will help you avoid constipation.    · Do not wear a rib belt or binder. These restrict breathing, which can lead to pneumonia.    SEEK IMMEDIATE MEDICAL CARE IF:   · You have a fever.    · You have difficulty breathing or shortness of breath.    · You develop a continual cough, or you cough up thick or bloody sputum.  · You feel sick to your stomach (nausea), throw up (vomit), or have abdominal pain.    · You have worsening pain not controlled with medications.      MAKE SURE YOU:  · Understand these instructions.  · Will watch your condition.  · Will get help right away if you are not doing well or get worse.  Document Released: 03/31/2005 Document Revised: 12/01/2012 Document Reviewed: 06/02/2012  ExitCare® Patient Information ©2014 [employer].

## 2013-06-06 NOTE — ED Provider Notes (Signed)
Medical screening examination/treatment/procedure(s) were performed by non-physician practitioner and as supervising physician I was immediately available for consultation/collaboration.  EKG Interpretation   None         Jenice Leiner L Jaylenn Altier, MD 06/06/13 0805 

## 2013-06-07 ENCOUNTER — Emergency Department (HOSPITAL_COMMUNITY): Payer: Non-veteran care

## 2013-06-07 ENCOUNTER — Emergency Department (HOSPITAL_COMMUNITY)
Admission: EM | Admit: 2013-06-07 | Discharge: 2013-06-07 | Disposition: A | Discharge status: Home / Self Care | Payer: Non-veteran care | Attending: Emergency Medicine | Admitting: Emergency Medicine

## 2013-06-07 ENCOUNTER — Encounter (HOSPITAL_COMMUNITY): Payer: Self-pay | Admitting: Emergency Medicine

## 2013-06-07 DIAGNOSIS — J449 Chronic obstructive pulmonary disease, unspecified: Secondary | ICD-10-CM | POA: Insufficient documentation

## 2013-06-07 DIAGNOSIS — I10 Essential (primary) hypertension: Secondary | ICD-10-CM | POA: Insufficient documentation

## 2013-06-07 DIAGNOSIS — R079 Chest pain, unspecified: Secondary | ICD-10-CM | POA: Insufficient documentation

## 2013-06-07 DIAGNOSIS — IMO0001 Reserved for inherently not codable concepts without codable children: Secondary | ICD-10-CM | POA: Insufficient documentation

## 2013-06-07 DIAGNOSIS — R0602 Shortness of breath: Secondary | ICD-10-CM | POA: Insufficient documentation

## 2013-06-07 DIAGNOSIS — Z87891 Personal history of nicotine dependence: Secondary | ICD-10-CM | POA: Insufficient documentation

## 2013-06-07 DIAGNOSIS — Z8619 Personal history of other infectious and parasitic diseases: Secondary | ICD-10-CM | POA: Insufficient documentation

## 2013-06-07 DIAGNOSIS — J4489 Other specified chronic obstructive pulmonary disease: Secondary | ICD-10-CM | POA: Insufficient documentation

## 2013-06-07 DIAGNOSIS — G8929 Other chronic pain: Secondary | ICD-10-CM | POA: Insufficient documentation

## 2013-06-07 DIAGNOSIS — S2249XA Multiple fractures of ribs, unspecified side, initial encounter for closed fracture: Secondary | ICD-10-CM

## 2013-06-07 DIAGNOSIS — R0781 Pleurodynia: Secondary | ICD-10-CM

## 2013-06-07 DIAGNOSIS — Z79899 Other long term (current) drug therapy: Secondary | ICD-10-CM | POA: Insufficient documentation

## 2013-06-07 DIAGNOSIS — Z8739 Personal history of other diseases of the musculoskeletal system and connective tissue: Secondary | ICD-10-CM | POA: Insufficient documentation

## 2013-06-07 MED ORDER — OXYCODONE-ACETAMINOPHEN 7.5-325 MG PO TABS: 1.0000 | ORAL_TABLET | ORAL | Status: DC | PRN

## 2013-06-07 NOTE — ED Notes (Addendum)
Pt c/o continued rib pain, recently fell with several fractures to left ribs, was seen in 06/04/2013, states that he is not getting any better, was unable to keep follow up appointment with pcp that was scheduled this am due to weather, has appointment rescheduled for Friday evening with orthopaedic,

## 2013-06-07 NOTE — ED Provider Notes (Signed)
CSN: [id number]     Arrival date & time 06/07/13  1011 History   First MD Initiated Contact with Patient 06/07/13 1026     Chief Complaint  Patient presents with  . Rib pain      (Consider location/radiation/quality/duration/timing/severity/associated sxs/prior Treatment) HPI Victor Davidson is a 63 y.o. male who presents to the ED with shortness of breath s/p multiple rib fractures. He has had one previous visit due to increased pain that was not controlled with Percocet. Today she states he feels short of breath and last night he felt a pop on the left side and felt like his ribs were rubbing together. [facility] at [facility] this morning but could not be seen until Friday.   Past Medical History  Diagnosis Date  . Hypertension   . Herniated disc   . Hepatitis C   . COPD (chronic obstructive pulmonary disease)   . Chronic pain   . Chronic pain of left knee    Past Surgical History  Procedure Laterality Date  . Knee surgery    . Total hip arthroplasty    . Tonsillectomy    . Joint replacement    . Ankle surgery     Family History  Problem Relation Age of Onset  . Cancer Mother    History  Substance Use Topics  . Smoking status: Former Smoker -- 30 years    Quit date: 09/02/2012  . Smokeless tobacco: Former NeurosurgeonUser    Types: Chew  . Alcohol Use: Yes     Comment: occassional    Review of Systems Negative except as stated in HPI   Allergies  Ketorolac tromethamine  Home Medications   Current Outpatient Rx  Name  Route  Sig  Dispense  Refill  . albuterol (PROVENTIL HFA;VENTOLIN HFA) 108 (90 BASE) MCG/ACT inhaler   Inhalation   Inhale 2 puffs into the lungs 2 (two) times daily as needed for wheezing or shortness of breath.         Marland Kitchen. amLODipine (NORVASC) 10 MG tablet   Oral   Take 10 mg by mouth daily.          Marland Kitchen. gabapentin (NEURONTIN) 300 MG capsule   Oral   Take 600 mg by mouth 3 (three) times daily. For nerve pain         . Multiple Vitamin  (MULTIVITAMIN WITH MINERALS) TABS   Oral   Take 1 tablet by mouth daily.         . nicotine (NICODERM CQ - DOSED IN MG/24 HR) 7 mg/24hr patch   Transdermal   Place 1 patch onto the skin daily.         Marland Kitchen. oxyCODONE-acetaminophen (PERCOCET/ROXICET) 5-325 MG per tablet   Oral   Take 2 tablets by mouth every 4 (four) hours as needed for severe pain.   30 tablet   0   . promethazine (PHENERGAN) 25 MG tablet   Oral   Take 25 mg by mouth every 6 (six) hours as needed for nausea or vomiting.         . propranolol (INDERAL) 20 MG tablet   Oral   Take 20 mg by mouth daily.          BP 139/76  Pulse 79  Temp(Src) 98.4 F (36.9 C) (Oral)  Resp 16  Ht 5' 6.5" (1.689 m)  Wt 155 lb (70.308 kg)  BMI 24.65 kg/m2  SpO2 97% Physical Exam  Nursing note and vitals reviewed.  Constitutional: He is oriented to person, place, and time. He appears well-developed and well-nourished. No distress.  HENT:  Head: Normocephalic and atraumatic.  Eyes: Conjunctivae and EOM are normal.  Neck: Normal range of motion. Neck supple.  Cardiovascular: Normal rate.   Pulmonary/Chest: He is in respiratory distress.  There is occasional rhonchi head on exam but no rales. No decreased breath sounds. Pt. With hx of COPD.  Musculoskeletal: Normal range of motion.  Ecchymosis noted left anterior rib area. Tender on palpation.   Neurological: He is alert and oriented to person, place, and time. No cranial nerve deficit.  Skin: Skin is warm and dry.  Psychiatric: He has a normal mood and affect. His behavior is normal.   Dg Chest 2 View  06/07/2013   CLINICAL DATA:  Shortness of breath  EXAM: CHEST  2 VIEW  COMPARISON:  Prior chest x-ray 06/04/2013  FINDINGS: Stable cardiac and mediastinal contours. Pulmonary hyper expansion and chronic bronchial wall thickening suggests underlying COPD. No focal airspace consolidation, pleural effusion or pneumothorax. Multiple bilateral remote healed rib fractures. No acute  osseous abnormality. Degenerative spurring in the lower thoracic spine.  IMPRESSION: 1. No acute cardiopulmonary process. 2. Stable background changes suggesting underlying COPD.   Electronically Signed   By: Malachy Moan M.D.   On: 06/07/2013 11:29    ED Course  Procedures   MDM  63 y.o. male with continued pain left rib area s/p rib fractures. Will treat for pain and he will keep his follow up appointment for Friday with his PCP. Stable for discharge without any immediate concern for pneumothorax.    Medication List    STOP taking these medications       oxyCODONE-acetaminophen 5-325 MG per tablet  Commonly known as:  PERCOCET/ROXICET  Replaced by:  oxyCODONE-acetaminophen 7.5-325 MG per tablet      TAKE these medications       oxyCODONE-acetaminophen 7.5-325 MG per tablet  Commonly known as:  PERCOCET  Take 1 tablet by mouth every 4 (four) hours as needed for pain.      ASK your doctor about these medications       albuterol 108 (90 BASE) MCG/ACT inhaler  Commonly known as:  PROVENTIL HFA;VENTOLIN HFA  Inhale 2 puffs into the lungs 2 (two) times daily as needed for wheezing or shortness of breath.     amLODipine 10 MG tablet  Commonly known as:  NORVASC  Take 10 mg by mouth daily.     gabapentin 300 MG capsule  Commonly known as:  NEURONTIN  Take 600 mg by mouth 3 (three) times daily. For nerve pain     multivitamin with minerals Tabs tablet  Take 1 tablet by mouth daily.     nicotine 7 mg/24hr patch  Commonly known as:  NICODERM CQ - dosed in mg/24 hr  Place 1 patch onto the skin daily.     promethazine 25 MG tablet  Commonly known as:  PHENERGAN  Take 25 mg by mouth every 6 (six) hours as needed for nausea or vomiting.     propranolol 20 MG tablet  Commonly known as:  INDERAL  Take 20 mg by mouth daily.           [facility] Orlene Och, [place] 06/07/13 1147

## 2013-06-07 NOTE — Discharge Instructions (Signed)
Follow up with your doctor on Friday as scheduled.

## 2013-06-07 NOTE — ED Notes (Signed)
[facility] speaking with pt,

## 2013-06-07 NOTE — ED Notes (Signed)
Pt states he fell last week and broke 5 ribs on the left. States extreme pain with deep breathing and coughing. States he felt a pop on left side last night and feels like ribs are rubbing together.

## 2013-06-09 NOTE — ED Provider Notes (Signed)
Medical screening examination/treatment/procedure(s) were performed by non-physician practitioner and as supervising physician I was immediately available for consultation/collaboration.  EKG Interpretation   None        Donnetta HutchingBrian Avantika Shere, MD 06/09/13 2035

## 2013-07-07 ENCOUNTER — Encounter (HOSPITAL_COMMUNITY): Payer: Self-pay | Admitting: Emergency Medicine

## 2013-07-07 ENCOUNTER — Emergency Department (HOSPITAL_COMMUNITY)
Admission: EM | Admit: 2013-07-07 | Discharge: 2013-07-07 | Disposition: A | Discharge status: Home / Self Care | Payer: Non-veteran care | Attending: Emergency Medicine | Admitting: Emergency Medicine

## 2013-07-07 ENCOUNTER — Emergency Department (HOSPITAL_COMMUNITY): Payer: Non-veteran care

## 2013-07-07 DIAGNOSIS — J4489 Other specified chronic obstructive pulmonary disease: Secondary | ICD-10-CM | POA: Insufficient documentation

## 2013-07-07 DIAGNOSIS — Z8619 Personal history of other infectious and parasitic diseases: Secondary | ICD-10-CM | POA: Insufficient documentation

## 2013-07-07 DIAGNOSIS — Y9389 Activity, other specified: Secondary | ICD-10-CM | POA: Insufficient documentation

## 2013-07-07 DIAGNOSIS — X500XXA Overexertion from strenuous movement or load, initial encounter: Secondary | ICD-10-CM | POA: Insufficient documentation

## 2013-07-07 DIAGNOSIS — S2232XA Fracture of one rib, left side, initial encounter for closed fracture: Secondary | ICD-10-CM

## 2013-07-07 DIAGNOSIS — I10 Essential (primary) hypertension: Secondary | ICD-10-CM | POA: Insufficient documentation

## 2013-07-07 DIAGNOSIS — Z87891 Personal history of nicotine dependence: Secondary | ICD-10-CM | POA: Insufficient documentation

## 2013-07-07 DIAGNOSIS — Z79899 Other long term (current) drug therapy: Secondary | ICD-10-CM | POA: Insufficient documentation

## 2013-07-07 DIAGNOSIS — Y929 Unspecified place or not applicable: Secondary | ICD-10-CM | POA: Insufficient documentation

## 2013-07-07 DIAGNOSIS — S2249XA Multiple fractures of ribs, unspecified side, initial encounter for closed fracture: Secondary | ICD-10-CM | POA: Insufficient documentation

## 2013-07-07 DIAGNOSIS — J449 Chronic obstructive pulmonary disease, unspecified: Secondary | ICD-10-CM | POA: Insufficient documentation

## 2013-07-07 DIAGNOSIS — Z8739 Personal history of other diseases of the musculoskeletal system and connective tissue: Secondary | ICD-10-CM | POA: Insufficient documentation

## 2013-07-07 DIAGNOSIS — G8929 Other chronic pain: Secondary | ICD-10-CM | POA: Insufficient documentation

## 2013-07-07 MED ORDER — OXYCODONE-ACETAMINOPHEN 5-325 MG PO TABS: 2.0000 | ORAL_TABLET | ORAL | Status: DC | PRN

## 2013-07-07 MED ORDER — OXYCODONE-ACETAMINOPHEN 5-325 MG PO TABS
1.0000 | ORAL_TABLET | Freq: Once | ORAL | Status: AC
  Administered 2013-07-07: 1 via ORAL
  Filled 2013-07-07: qty 1

## 2013-07-07 MED ORDER — OXYCODONE-ACETAMINOPHEN 5-325 MG PO TABS: 1.0000 | ORAL_TABLET | Freq: Four times a day (QID) | ORAL | Status: DC | PRN

## 2013-07-07 NOTE — ED Notes (Signed)
Was working on car today when he felt a pop on L. Side wear he had previous rib fracture. States pain is constant in nature as it hurts every time he breathes. Rates pain as 8/10. Pain radiates to back with every breath.

## 2013-07-07 NOTE — ED Provider Notes (Signed)
CSN: [id number]     Arrival date & time 07/07/13  1803 History   First MD Initiated Contact with Patient 07/07/13 1859     Chief Complaint  Patient presents with  . Rib Injury     (Consider location/radiation/quality/duration/timing/severity/associated sxs/prior Treatment) The history is provided by the patient.   patient had 4 broken ribs around 5 weeks ago. He states he been doing better, but while he was bending over the front of his truck to changing spark plugs he felt a pop in his left chest and had some pain and difficulty breathing. He states it feels like the site of some of the previous rib fractures. He states he has pain over his left chest. No hemoptysis. No real difficulty breathing, but it is more painful to breathe.  Past Medical History  Diagnosis Date  . Hypertension   . Herniated disc   . Hepatitis C   . COPD (chronic obstructive pulmonary disease)   . Chronic pain   . Chronic pain of left knee    Past Surgical History  Procedure Laterality Date  . Knee surgery    . Total hip arthroplasty    . Tonsillectomy    . Joint replacement    . Ankle surgery     Family History  Problem Relation Age of Onset  . Cancer Mother    History  Substance Use Topics  . Smoking status: Former Smoker -- 30 years    Quit date: 09/02/2012  . Smokeless tobacco: Former Neurosurgeon    Types: Chew  . Alcohol Use: Yes     Comment: occassional    Review of Systems  Constitutional: Negative for chills.  Respiratory: Positive for cough.   Cardiovascular: Positive for chest pain.  Gastrointestinal: Negative for abdominal pain.  Genitourinary: Negative for dysuria.  Musculoskeletal: Negative for back pain.  Skin: Negative for color change.  Neurological: Negative for light-headedness.  Psychiatric/Behavioral: Negative for confusion.      Allergies  Ketorolac tromethamine  Home Medications   Current Outpatient Rx  Name  Route  Sig  Dispense  Refill  . albuterol (PROVENTIL  HFA;VENTOLIN HFA) 108 (90 BASE) MCG/ACT inhaler   Inhalation   Inhale 2 puffs into the lungs 2 (two) times daily as needed for wheezing or shortness of breath.         Marland Kitchen amLODipine (NORVASC) 10 MG tablet   Oral   Take 10 mg by mouth daily.          Marland Kitchen gabapentin (NEURONTIN) 300 MG capsule   Oral   Take 600 mg by mouth 3 (three) times daily. For nerve pain         . Multiple Vitamin (MULTIVITAMIN WITH MINERALS) TABS   Oral   Take 1 tablet by mouth daily.         . nicotine (NICODERM CQ - DOSED IN MG/24 HR) 7 mg/24hr patch   Transdermal   Place 1 patch onto the skin daily.         . propranolol (INDERAL) 20 MG tablet   Oral   Take 20 mg by mouth daily.         Marland Kitchen oxyCODONE-acetaminophen (PERCOCET/ROXICET) 5-325 MG per tablet   Oral   Take 2 tablets by mouth every 4 (four) hours as needed for severe pain.   6 tablet   0   . oxyCODONE-acetaminophen (PERCOCET/ROXICET) 5-325 MG per tablet   Oral   Take 1-2 tablets by mouth every 6 (six) hours as needed  for severe pain.   10 tablet   0    BP 124/71  Pulse 78  Temp(Src) 98 F (36.7 C) (Oral)  Resp 22  Ht 5\' 6"  (1.676 m)  Wt 160 lb (72.576 kg)  BMI 25.84 kg/m2  SpO2 96% Physical Exam  Constitutional: He is oriented to person, place, and time. He appears well-developed.  HENT:  Head: Normocephalic.  Eyes: Pupils are equal, round, and reactive to light.  Neck: Neck supple.  Cardiovascular: Normal rate and regular rhythm.   Pulmonary/Chest: Effort normal and breath sounds normal. He exhibits tenderness.  Visible pain with deep inspiration. Some point tenderness to left anterior lower chest wall. No subcutaneous emphysema.  Abdominal: Soft. There is no tenderness.  Musculoskeletal: He exhibits no edema.  Neurological: He is alert and oriented to person, place, and time.    ED Course  Procedures (including critical care time) Labs Review Labs Reviewed - No data to display Imaging Review Dg Ribs Unilateral  W/chest Left  07/07/2013   CLINICAL DATA:  Left chest pain, recent left rib fractures.  EXAM: LEFT RIBS AND CHEST - 3+ VIEW  COMPARISON:  DG CHEST 2 VIEW dated 06/07/2013; DG CHEST 2 VIEW dated 06/04/2013; DG RIBS UNILATERAL W/CHEST*L* dated 06/01/2013  FINDINGS: Cardiomediastinal silhouette is nonsuspicious, mildly tortuous aorta. Right costophrenic finger linear densities without pleural effusions. No pneumothorax.  Remote right lateral rib fractures. Chronic appearing right anterolateral 6 through ninth rib fractures.  IMPRESSION: Linear densities in right lung base reflects atelectasis scarring.  Multiple chronic appearing fractures without convincing evidence of acute rib fracture deformity.   Electronically Signed   By: Awilda Metroourtnay  Bloomer   On: 07/07/2013 19:48     EKG Interpretation None      MDM   Final diagnoses:  Left rib fracture    Patient was left rib pain. Recent fractures on that side. He is point tender and he felt a pop with bending over. X-ray does not show fracture, however he be treated with pain medicine. No pneumothorax.    Juliet RudeNathan R. Rubin PayorPickering, MD 07/07/13 2101

## 2013-07-07 NOTE — Discharge Instructions (Signed)
Rib Fracture  A rib fracture is a break or crack in one of the bones of the ribs. The ribs are a group of long, curved bones that wrap around your chest and attach to your spine. They protect your lungs and other organs in the chest cavity. A broken or cracked rib is often painful, but most do not cause other problems. Most rib fractures heal on their own over time. However, rib fractures can be more serious if multiple ribs are broken or if broken ribs move out of place and push against other structures.  CAUSES   · A direct blow to the chest. For example, this could happen during contact sports, a car accident, or a fall against a hard object.  · Repetitive movements with high force, such as pitching a baseball or having severe coughing spells.  SYMPTOMS   · Pain when you breathe in or cough.  · Pain when someone presses on the injured area.  DIAGNOSIS   Your caregiver will perform a physical exam. Various imaging tests may be ordered to confirm the diagnosis and to look for related injuries. These tests may include a chest X-ray, computed tomography (CT), magnetic resonance imaging (MRI), or a bone scan.  TREATMENT   Rib fractures usually heal on their own in 1 3 months. The longer healing period is often associated with a continued cough or other aggravating activities. During the healing period, pain control is very important. Medication is usually given to control pain. Hospitalization or surgery may be needed for more severe injuries, such as those in which multiple ribs are broken or the ribs have moved out of place.   HOME CARE INSTRUCTIONS   · Avoid strenuous activity and any activities or movements that cause pain. Be careful during activities and avoid bumping the injured rib.  · Gradually increase activity as directed by your caregiver.  · Only take over-the-counter or prescription medications as directed by your caregiver. Do not take other medications without asking your caregiver first.  · Apply ice  to the injured area for the first 1 2 days after you have been treated or as directed by your caregiver. Applying ice helps to reduce inflammation and pain.  · Put ice in a plastic bag.  · Place a towel between your skin and the bag.    · Leave the ice on for 15 20 minutes at a time, every 2 hours while you are awake.  · Perform deep breathing as directed by your caregiver. This will help prevent pneumonia, which is a common complication of a broken rib. Your caregiver may instruct you to:  · Take deep breaths several times a day.  · Try to cough several times a day, holding a pillow against the injured area.  · Use a device called an incentive spirometer to practice deep breathing several times a day.  · Drink enough fluids to keep your urine clear or pale yellow. This will help you avoid constipation.    · Do not wear a rib belt or binder. These restrict breathing, which can lead to pneumonia.    SEEK IMMEDIATE MEDICAL CARE IF:   · You have a fever.    · You have difficulty breathing or shortness of breath.    · You develop a continual cough, or you cough up thick or bloody sputum.  · You feel sick to your stomach (nausea), throw up (vomit), or have abdominal pain.    · You have worsening pain not controlled with medications.      MAKE SURE YOU:  · Understand these instructions.  · Will watch your condition.  · Will get help right away if you are not doing well or get worse.  Document Released: 03/31/2005 Document Revised: 12/01/2012 Document Reviewed: 06/02/2012  ExitCare® Patient Information ©2014 [employer].

## 2013-07-07 NOTE — ED Notes (Signed)
Pt injured left side and broke 4 ribs in Feb, pt states today he fell on the engine of his car and felt a pop in the same spot as previous injury, pt states he has had difficulty breathing since that time.

## 2013-07-12 MED FILL — Oxycodone w/ Acetaminophen Tab 5-325 MG: ORAL | Qty: 6 | Status: AC

## 2013-07-24 ENCOUNTER — Emergency Department (HOSPITAL_COMMUNITY)
Admission: EM | Admit: 2013-07-24 | Discharge: 2013-07-25 | Disposition: A | Discharge status: Other Acute Inpatient Hospital | Payer: Non-veteran care | Source: Home / Self Care | Attending: Emergency Medicine | Admitting: Emergency Medicine

## 2013-07-24 ENCOUNTER — Encounter (HOSPITAL_COMMUNITY): Payer: Self-pay | Admitting: Emergency Medicine

## 2013-07-24 DIAGNOSIS — J4489 Other specified chronic obstructive pulmonary disease: Secondary | ICD-10-CM | POA: Insufficient documentation

## 2013-07-24 DIAGNOSIS — Z598 Other problems related to housing and economic circumstances: Secondary | ICD-10-CM | POA: Insufficient documentation

## 2013-07-24 DIAGNOSIS — F4310 Post-traumatic stress disorder, unspecified: Secondary | ICD-10-CM | POA: Insufficient documentation

## 2013-07-24 DIAGNOSIS — J449 Chronic obstructive pulmonary disease, unspecified: Secondary | ICD-10-CM | POA: Insufficient documentation

## 2013-07-24 DIAGNOSIS — Z8719 Personal history of other diseases of the digestive system: Secondary | ICD-10-CM

## 2013-07-24 DIAGNOSIS — I10 Essential (primary) hypertension: Secondary | ICD-10-CM | POA: Insufficient documentation

## 2013-07-24 DIAGNOSIS — Z5987 Material hardship due to limited financial resources, not elsewhere classified: Secondary | ICD-10-CM | POA: Insufficient documentation

## 2013-07-24 DIAGNOSIS — F1020 Alcohol dependence, uncomplicated: Secondary | ICD-10-CM

## 2013-07-24 DIAGNOSIS — K7460 Unspecified cirrhosis of liver: Secondary | ICD-10-CM | POA: Insufficient documentation

## 2013-07-24 DIAGNOSIS — Z87891 Personal history of nicotine dependence: Secondary | ICD-10-CM | POA: Insufficient documentation

## 2013-07-24 DIAGNOSIS — Z8619 Personal history of other infectious and parasitic diseases: Secondary | ICD-10-CM

## 2013-07-24 DIAGNOSIS — IMO0002 Reserved for concepts with insufficient information to code with codable children: Secondary | ICD-10-CM

## 2013-07-24 DIAGNOSIS — F1010 Alcohol abuse, uncomplicated: Secondary | ICD-10-CM | POA: Insufficient documentation

## 2013-07-24 DIAGNOSIS — Z79899 Other long term (current) drug therapy: Secondary | ICD-10-CM

## 2013-07-24 DIAGNOSIS — G8929 Other chronic pain: Secondary | ICD-10-CM | POA: Insufficient documentation

## 2013-07-24 DIAGNOSIS — F1410 Cocaine abuse, uncomplicated: Secondary | ICD-10-CM | POA: Insufficient documentation

## 2013-07-24 DIAGNOSIS — Z8739 Personal history of other diseases of the musculoskeletal system and connective tissue: Secondary | ICD-10-CM | POA: Insufficient documentation

## 2013-07-24 DIAGNOSIS — F1310 Sedative, hypnotic or anxiolytic abuse, uncomplicated: Secondary | ICD-10-CM | POA: Insufficient documentation

## 2013-07-24 HISTORY — DX: Unspecified cirrhosis of liver: K74.60

## 2013-07-24 LAB — COMPREHENSIVE METABOLIC PANEL
ALBUMIN: 3.7 g/dL (ref 3.5–5.2)
ALK PHOS: 124 U/L — AB (ref 39–117)
ALT: 96 U/L — ABNORMAL HIGH (ref 0–53)
AST: 135 U/L — AB (ref 0–37)
BILIRUBIN TOTAL: 1 mg/dL (ref 0.3–1.2)
BUN: 6 mg/dL (ref 6–23)
CHLORIDE: 98 meq/L (ref 96–112)
CO2: 20 meq/L (ref 19–32)
Calcium: 9.6 mg/dL (ref 8.4–10.5)
Creatinine, Ser: 0.82 mg/dL (ref 0.50–1.35)
GFR calc Af Amer: >90 mL/min (ref >90)
Glucose, Bld: 119 mg/dL — ABNORMAL HIGH (ref 70–99)
POTASSIUM: 3.5 meq/L — AB (ref 3.7–5.3)
Sodium: 134 mEq/L — ABNORMAL LOW (ref 137–147)
Total Protein: 8.2 g/dL (ref 6.0–8.3)

## 2013-07-24 LAB — RAPID URINE DRUG SCREEN, HOSP PERFORMED
Amphetamines: NOT DETECTED
BARBITURATES: NOT DETECTED
BENZODIAZEPINES: POSITIVE — AB
COCAINE: POSITIVE — AB
Opiates: NOT DETECTED
Tetrahydrocannabinol: NOT DETECTED

## 2013-07-24 LAB — CBC
HCT: 44.2 % (ref 39.0–52.0)
HEMOGLOBIN: 15.9 g/dL (ref 13.0–17.0)
MCH: 33.3 pg (ref 26.0–34.0)
MCHC: 36 g/dL (ref 30.0–36.0)
MCV: 92.7 fL (ref 78.0–100.0)
Platelets: 103 10*3/uL — ABNORMAL LOW (ref 150–400)
RBC: 4.77 MIL/uL (ref 4.22–5.81)
RDW: 13.4 % (ref 11.5–15.5)
WBC: 8 10*3/uL (ref 4.0–10.5)

## 2013-07-24 LAB — ETHANOL: Alcohol, Ethyl (B): 57 mg/dL — ABNORMAL HIGH (ref 0–11)

## 2013-07-24 LAB — ACETAMINOPHEN LEVEL: Acetaminophen (Tylenol), Serum: <15 ug/mL (ref 10–30)

## 2013-07-24 LAB — SALICYLATE LEVEL: Salicylate Lvl: <2 mg/dL — ABNORMAL LOW (ref 2.8–20.0)

## 2013-07-24 MED ORDER — LORAZEPAM 1 MG PO TABS
2.0000 mg | ORAL_TABLET | Freq: Once | ORAL | Status: AC
  Administered 2013-07-24: 2 mg via ORAL
  Filled 2013-07-24: qty 2

## 2013-07-24 NOTE — BHH Counselor (Signed)
2218:  Spoke with Dr. Adriana Simasook at [facility] in reference to the plan for the PT to be discharged to OBS unit after 0900 on 07-24-2013 to await [facility].  Dr. Adriana Simasook was agreeable to the plan.

## 2013-07-24 NOTE — ED Notes (Signed)
[facility]. Pt reports was suicidal but reports is not at this time. Pt reports is [facility] in the morning at 730. Pt reports phychiatrist told him this am to come to ED to be evaluated for SI. Pt reports been drinking x1 week and reports " i knew i wouldn't be clean for in the morning so I called my psychiatrist for help." Pt denies any n/v/d. Pt alert and oriented.

## 2013-07-24 NOTE — ED Notes (Addendum)
EDP JUST IN WITH PT

## 2013-07-24 NOTE — ED Provider Notes (Signed)
CSN: [id number]     Arrival date & time 07/24/13  1255 History  This chart was scribed for Donnetta HutchingBrian Tashae Inda, MD by Leone PayorSonum Patel, ED Scribe. This patient was seen in room [facility] and the patient's care was started 2:25 PM.    Chief Complaint  Patient presents with  . V70.1     The history is provided by the patient. No language interpreter was used.    HPI Comments: Victor Davidson is a 63 y.o. male who presents to the Emergency Department complaining of ETOH abuse today. Patient states he [place] he was released 2 days ago. He states he is [facility]. Patient states that he could not wait until the program started and began drinking again because he was scared. He states he still wants help with his addiction and would like to attend the program tomorrow. He reports drinking a 12-pack of beer daily and is smoking marijuana. Patient states he recently came into some money and he has nothing do anymore besides drink. He has had 4 beers today.   Past Medical History  Diagnosis Date  . Hypertension   . Herniated disc   . Hepatitis C   . COPD (chronic obstructive pulmonary disease)   . Chronic pain   . Chronic pain of left knee   . Cirrhosis    Past Surgical History  Procedure Laterality Date  . Knee surgery    . Total hip arthroplasty    . Tonsillectomy    . Joint replacement    . Ankle surgery     Family History  Problem Relation Age of Onset  . Cancer Mother    History  Substance Use Topics  . Smoking status: Former Smoker -- 0.50 packs/day for 30 years    Quit date: 09/02/2012  . Smokeless tobacco: Former NeurosurgeonUser  . Alcohol Use: Yes     Comment: daily use    Review of Systems  A complete 10 system review of systems was obtained and all systems are negative except as noted in the HPI and PMH.    Allergies  Ketorolac tromethamine  Home Medications   Current Outpatient Rx  Name   Route  Sig  Dispense  Refill  . albuterol (PROVENTIL HFA;VENTOLIN HFA) 108 (90 BASE) MCG/ACT inhaler   Inhalation   Inhale 2 puffs into the lungs 2 (two) times daily as needed for wheezing or shortness of breath.         Marland Kitchen. amLODipine (NORVASC) 10 MG tablet   Oral   Take 10 mg by mouth daily.          Marland Kitchen. gabapentin (NEURONTIN) 300 MG capsule   Oral   Take 600 mg by mouth 3 (three) times daily. For nerve pain         . Multiple Vitamin (MULTIVITAMIN WITH MINERALS) TABS   Oral   Take 1 tablet by mouth daily.         . propranolol (INDERAL) 20 MG tablet   Oral   Take 20 mg by mouth daily.          BP 157/98  Pulse 115  Temp(Src) 98.2 F (36.8 C) (Oral)  Resp 18  Ht 5\' 6"  (1.676 m)  Wt 160 lb (72.576 kg)  BMI 25.84 kg/m2  SpO2 97% Physical Exam  Nursing note and vitals reviewed. Constitutional: He is oriented to person, place, and time.  He appears well-developed and well-nourished.  HENT:  Head: Normocephalic and atraumatic.  Eyes: Conjunctivae and EOM are normal. Pupils are equal, round, and reactive to light.  Neck: Normal range of motion. Neck supple.  Cardiovascular: Normal rate, regular rhythm and normal heart sounds.   Pulmonary/Chest: Effort normal and breath sounds normal.  Abdominal: Soft. Bowel sounds are normal.  Musculoskeletal: Normal range of motion.  Neurological: He is alert and oriented to person, place, and time.  Skin: Skin is warm and dry.  Psychiatric: He has a normal mood and affect. He is agitated.  Agitated     ED Course  Procedures (including critical care time)  DIAGNOSTIC STUDIES: Oxygen Saturation is 97% on RA, adequate by my interpretation.    COORDINATION OF CARE: 2:30 PM Discussed treatment plan with pt at bedside and pt agreed to plan.   Labs Review Labs Reviewed  CBC - Abnormal; Notable for the following:    Platelets 103 (*)    All other components within normal limits  COMPREHENSIVE METABOLIC PANEL - Abnormal;  Notable for the following:    Sodium 134 (*)    Potassium 3.5 (*)    Glucose, Bld 119 (*)    AST 135 (*)    ALT 96 (*)    Alkaline Phosphatase 124 (*)    All other components within normal limits  ETHANOL - Abnormal; Notable for the following:    Alcohol, Ethyl (B) 57 (*)    All other components within normal limits  SALICYLATE LEVEL - Abnormal; Notable for the following:    Salicylate Lvl <2.0 (*)    All other components within normal limits  URINE RAPID DRUG SCREEN (HOSP PERFORMED) - Abnormal; Notable for the following:    Cocaine POSITIVE (*)    Benzodiazepines POSITIVE (*)    All other components within normal limits  ACETAMINOPHEN LEVEL   Imaging Review No results found.   EKG Interpretation   Date/Time:  Sunday July 24 2013 14:31:48 EDT Ventricular Rate:  103 PR Interval:  142 QRS Duration: 82 QT Interval:  350 QTC Calculation: 458 R Axis:   7 Text Interpretation:  Sinus tachycardia Otherwise normal ECG When compared  with ECG of 04-Oct-2012 18:33, No significant change was found Confirmed  by Adriana Simas  MD, Anasophia Pecor ([id number]) on 07/24/2013 3:01:21 PM      MDM   Final diagnoses:  None   Dr Estell Harpin spoke with Dr Elizabeth Palau [psychiatrist] at the [place] [facility] [place] [facility].    Cell [phone] or [phone].    [facility] patient on Monday morning"   1630:  Discussion with behavioral health.  [facility] discussed case with the [facility] therapist.   Patient will need to be off alcohol and/or drugs for "48 hours" prior to admission to [place] system  2000:   Re discussion c BHS.  Will re evaluate      I personally performed the services described in this documentation, which was scribed in my presence. The recorded information has been reviewed and is accurate.   Donnetta Hutching, MD 07/24/13 2153

## 2013-07-24 NOTE — BH Assessment (Signed)
Tele-reassessment scheduled with Bonita QuinLinda for 2040.

## 2013-07-24 NOTE — BH Assessment (Signed)
Received call for assessment. Spoke with Victor HutchingBrian Cook, MD who said Pt relapsed on alcohol after [place]. [facility] but he needs to be sober until then. Tele-assessment will be initiated.  Harlin RainFord Ellis Ria CommentWarrick Jr, LPC, [facility] Triage Specialist [phone]

## 2013-07-24 NOTE — ED Provider Notes (Signed)
Discussed with SW. Plan if for Victor Davidson to stay in [facility] ED overnight, [facility] 09[facility] on 07-[facility]-2013 to await [facility].    Victor RazorStephen Jeralyn Nolden, MD 07/24/13 [phone]

## 2013-07-24 NOTE — BH Assessment (Signed)
Assessment Note  Ferrell Donnelly StagerR Cheese is an 63 y.o. male.  PT was reassessed on this day.  PT denied any current SI and reported last episode was on Wednesday, 07-20-2013.  He denied HI, AH, or VH.  PT reported experiencing current  withdrawal symptoms of hand tremors and that his appetite returned at the evening meal.  He reported currently experiencing cravings to consume alcohol.  He reported getting 3 hours of sleep the previous night.  The PT reported his mood as "a relief" and reported being hopeful about upcoming inpatient SA treatment.  The PT reported overall feeling better tonight than this morning.  The PT was orientated x4.   Axis I: Alcohol Dependence Axis II: Deferred Axis III:  Past Medical History  Diagnosis Date  . Hypertension   . Herniated disc   . Hepatitis C   . COPD (chronic obstructive pulmonary disease)   . Chronic pain   . Chronic pain of left knee   . Cirrhosis    Axis IV: problems with primary support group Axis V: 31-40 impairment in reality testing  Past Medical History:  Past Medical History  Diagnosis Date  . Hypertension   . Herniated disc   . Hepatitis C   . COPD (chronic obstructive pulmonary disease)   . Chronic pain   . Chronic pain of left knee   . Cirrhosis     Past Surgical History  Procedure Laterality Date  . Knee surgery    . Total hip arthroplasty    . Tonsillectomy    . Joint replacement    . Ankle surgery      Family History:  Family History  Problem Relation Age of Onset  . Cancer Mother     Social History:  reports that he quit smoking about 10 months ago. He has quit using smokeless tobacco. He reports that he drinks alcohol. He reports that he uses illicit drugs (Cocaine).  Additional Social History:     CIWA: CIWA-Ar BP: 169/80 mmHg Pulse Rate: 107 Nausea and Vomiting: no nausea and no vomiting Tactile Disturbances: moderate itching, pins and needles, burning or numbness Tremor: three Auditory Disturbances: not  present Paroxysmal Sweats: no sweat visible Visual Disturbances: not present Anxiety: moderately anxious, or guarded, so anxiety is inferred Headache, Fullness in Head: none present Agitation: normal activity Orientation and Clouding of Sensorium: oriented and can do serial additions CIWA-Ar Total: 10 COWS:    Allergies:  Allergies  Allergen Reactions  . Ketorolac Tromethamine Nausea Only    Burning at injection site x 3 days. Pt refused to take on 12/15/12, states it makes him "sick"    Home Medications:  (Not in a hospital admission)  OB/GYN Status:  No LMP for male patient.  General Assessment Data Admission Status: Voluntary           Risk to self Is patient at risk for suicide?: Yes Substance abuse history and/or treatment for substance abuse?: Yes (last use 3 beers this am, smoked cocaine 0300 this am )        Mental Status Report Motor Activity: Unremarkable                        Values / Beliefs Cultural Requests During Hospitalization: None Spiritual Requests During Hospitalization: None              Disposition:     On Site Evaluation by:   Reviewed with Physician:    Sherilyn CooterIvy Dey-Johnson 07/24/2013  9:04 PM

## 2013-07-24 NOTE — ED Notes (Signed)
PT UP TO RESTROOM WITH SITTER. 

## 2013-07-24 NOTE — BH Assessment (Signed)
BHH Assessment Progress Note  Spoke with Dr. Adriana Simasook re: consult for patient, and pt was [facility].  Dr. Adriana Simasook does not think that pt is suicidal, and asked writer to confirm bed at treatment center.    Writer spoke with [facility]Kiley at [facility] [phone]), who said that patients know they need to be drug/alcohol free for 48 hours before entering program, and pt relapsed this week and has alcohol level of 48.  Dr. Adriana Simasook wants to hold off on TTS consult for now and will consider holding pt in ED until he can possibly get a bed on Tuesday am.  [facility] will let TTS know if an assessment needs to be done.

## 2013-07-24 NOTE — ED Notes (Signed)
Pt wanded in triage  

## 2013-07-25 ENCOUNTER — Observation Stay (HOSPITAL_COMMUNITY)
Admission: AD | Admit: 2013-07-25 | Discharge: 2013-07-26 | Disposition: A | Discharge status: Home / Self Care | Payer: Non-veteran care | Source: Intra-hospital | Attending: Family | Admitting: Family

## 2013-07-25 ENCOUNTER — Encounter (HOSPITAL_COMMUNITY): Payer: Self-pay | Admitting: Emergency Medicine

## 2013-07-25 DIAGNOSIS — F329 Major depressive disorder, single episode, unspecified: Secondary | ICD-10-CM | POA: Diagnosis present

## 2013-07-25 MED ORDER — GABAPENTIN 300 MG PO CAPS
600.0000 mg | ORAL_CAPSULE | Freq: Three times a day (TID) | ORAL | Status: DC
  Administered 2013-07-25 (×2): 600 mg via ORAL
  Filled 2013-07-25 (×7): qty 2

## 2013-07-25 MED ORDER — ZOLPIDEM TARTRATE 5 MG PO TABS: 5.0000 mg | ORAL_TABLET | Freq: Every evening | ORAL | Status: DC | PRN

## 2013-07-25 MED ORDER — ADULT MULTIVITAMIN W/MINERALS CH
1.0000 | ORAL_TABLET | Freq: Every day | ORAL | Status: DC
  Administered 2013-07-25: 1 via ORAL
  Filled 2013-07-25: qty 1

## 2013-07-25 MED ORDER — ONDANSETRON HCL 4 MG PO TABS: 4.0000 mg | ORAL_TABLET | Freq: Three times a day (TID) | ORAL | Status: DC | PRN

## 2013-07-25 MED ORDER — ALBUTEROL SULFATE HFA 108 (90 BASE) MCG/ACT IN AERS: 2.0000 | INHALATION_SPRAY | Freq: Two times a day (BID) | RESPIRATORY_TRACT | Status: DC | PRN

## 2013-07-25 MED ORDER — GABAPENTIN 300 MG PO CAPS
600.0000 mg | ORAL_CAPSULE | Freq: Three times a day (TID) | ORAL | Status: DC
  Administered 2013-07-26 (×2): 600 mg via ORAL
  Filled 2013-07-25 (×5): qty 2

## 2013-07-25 MED ORDER — AMLODIPINE BESYLATE 5 MG PO TABS
10.0000 mg | ORAL_TABLET | Freq: Every day | ORAL | Status: DC
  Administered 2013-07-25: 10 mg via ORAL
  Filled 2013-07-25: qty 2

## 2013-07-25 MED ORDER — ADULT MULTIVITAMIN W/MINERALS CH
1.0000 | ORAL_TABLET | Freq: Every day | ORAL | Status: DC
  Administered 2013-07-26: 1 via ORAL
  Filled 2013-07-25 (×3): qty 1

## 2013-07-25 MED ORDER — CHLORDIAZEPOXIDE HCL 25 MG PO CAPS: 25.0000 mg | ORAL_CAPSULE | Freq: Three times a day (TID) | ORAL | Status: DC

## 2013-07-25 MED ORDER — PROPRANOLOL HCL 20 MG PO TABS
20.0000 mg | ORAL_TABLET | Freq: Every day | ORAL | Status: DC
  Administered 2013-07-26: 20 mg via ORAL
  Filled 2013-07-25: qty 1
  Filled 2013-07-25: qty 2
  Filled 2013-07-25: qty 1

## 2013-07-25 MED ORDER — ALUM & MAG HYDROXIDE-SIMETH 200-200-20 MG/5ML PO SUSP: 30.0000 mL | ORAL | Status: DC | PRN

## 2013-07-25 MED ORDER — CHLORDIAZEPOXIDE HCL 25 MG PO CAPS
25.0000 mg | ORAL_CAPSULE | Freq: Four times a day (QID) | ORAL | Status: DC
  Administered 2013-07-25 – 2013-07-26 (×2): 25 mg via ORAL
  Filled 2013-07-25 (×3): qty 1

## 2013-07-25 MED ORDER — AMLODIPINE BESYLATE 10 MG PO TABS
10.0000 mg | ORAL_TABLET | Freq: Every day | ORAL | Status: DC
  Administered 2013-07-26: 10 mg via ORAL
  Filled 2013-07-25: qty 2
  Filled 2013-07-25: qty 1
  Filled 2013-07-25: qty 2
  Filled 2013-07-25: qty 1

## 2013-07-25 MED ORDER — ADULT MULTIVITAMIN W/MINERALS CH
1.0000 | ORAL_TABLET | Freq: Every day | ORAL | Status: DC
  Administered 2013-07-26: 1 via ORAL
  Filled 2013-07-25 (×2): qty 1

## 2013-07-25 MED ORDER — CHLORDIAZEPOXIDE HCL 25 MG PO CAPS: 25.0000 mg | ORAL_CAPSULE | ORAL | Status: DC

## 2013-07-25 MED ORDER — NICOTINE 7 MG/24HR TD PT24
7.0000 mg | MEDICATED_PATCH | Freq: Every day | TRANSDERMAL | Status: DC
  Administered 2013-07-25: 7 mg via TRANSDERMAL
  Filled 2013-07-25 (×4): qty 1

## 2013-07-25 MED ORDER — CHLORDIAZEPOXIDE HCL 25 MG PO CAPS
25.0000 mg | ORAL_CAPSULE | Freq: Four times a day (QID) | ORAL | Status: DC | PRN
Start: 2013-07-25 — End: 2013-07-26
  Filled 2013-07-25: qty 1

## 2013-07-25 MED ORDER — LORAZEPAM 1 MG PO TABS: 1.0000 mg | ORAL_TABLET | Freq: Three times a day (TID) | ORAL | Status: DC | PRN

## 2013-07-25 MED ORDER — PROPRANOLOL HCL 10 MG PO TABS
20.0000 mg | ORAL_TABLET | Freq: Every day | ORAL | Status: DC
  Administered 2013-07-25: 20 mg via ORAL
  Filled 2013-07-25: qty 2

## 2013-07-25 MED ORDER — MAGNESIUM HYDROXIDE 400 MG/5ML PO SUSP: 30.0000 mL | Freq: Every day | ORAL | Status: DC | PRN

## 2013-07-25 MED ORDER — CHLORDIAZEPOXIDE HCL 25 MG PO CAPS: 25.0000 mg | ORAL_CAPSULE | Freq: Every day | ORAL | Status: DC

## 2013-07-25 MED ORDER — THIAMINE HCL 100 MG/ML IJ SOLN: 100.0000 mg | Freq: Once | INTRAMUSCULAR | Status: DC

## 2013-07-25 MED ORDER — ACETAMINOPHEN 325 MG PO TABS: 650.0000 mg | ORAL_TABLET | ORAL | Status: DC | PRN

## 2013-07-25 MED ORDER — LOPERAMIDE HCL 2 MG PO CAPS: 2.0000 mg | ORAL_CAPSULE | ORAL | Status: DC | PRN

## 2013-07-25 MED ORDER — TRAZODONE HCL 50 MG PO TABS
50.0000 mg | ORAL_TABLET | Freq: Every evening | ORAL | Status: DC | PRN
Start: 2013-07-26 — End: 2013-07-26
  Filled 2013-07-25 (×2): qty 1

## 2013-07-25 MED ORDER — HYDROXYZINE HCL 25 MG PO TABS: 25.0000 mg | ORAL_TABLET | Freq: Four times a day (QID) | ORAL | Status: DC | PRN

## 2013-07-25 MED ORDER — ONDANSETRON 4 MG PO TBDP: 4.0000 mg | ORAL_TABLET | Freq: Four times a day (QID) | ORAL | Status: DC | PRN

## 2013-07-25 MED ORDER — VITAMIN B-1 100 MG PO TABS
100.0000 mg | ORAL_TABLET | Freq: Every day | ORAL | Status: DC
  Administered 2013-07-26: 100 mg via ORAL
  Filled 2013-07-25 (×3): qty 1

## 2013-07-25 NOTE — ED Notes (Signed)
Pt escorted to transport vehicle & belongings placed in car w/ driver

## 2013-07-25 NOTE — ED Notes (Signed)
Pt given phone to call [facility] and [facility]. [facility].

## 2013-07-25 NOTE — ED Notes (Signed)
Report given to Tyler RN

## 2013-07-25 NOTE — ED Notes (Signed)
Pt escorted to transport vehicle.

## 2013-07-25 NOTE — H&P (Signed)
[facility] Face-to-Face Psychiatry Consult   Reason for Consult:  Alcohol Detox Referring Physician:  [place] [facility] Victor Davidson is an 63 y.o. male. Total Time spent with patient: 45 minutes  Assessment: AXIS I:  Alcohol Abuse and Post Traumatic Stress Disorder AXIS II:  No diagnosis AXIS III:   Past Medical History  Diagnosis Date  . Hypertension   . Herniated disc   . Hepatitis C   . COPD (chronic obstructive pulmonary disease)   . Chronic pain   . Chronic pain of left knee   . Cirrhosis    AXIS IV:  economic problems and problems with primary support group AXIS V:  31-40 impairment in reality testing  Plan:  Recommend psychiatric Inpatient admission when medically cleared.  Subjective:   Victor Davidson is a 63 y.o. male patient admitted to the [facility] observation unit as he's transferred from the [facility] with reported eventual placement to the [facility] in [place]. Patient is endorsing multiple prior admissions to the [facility] in [place] for ongoing mgmt of PTSD and Alcohol abuse. Patient notes precipitating factors that have caused him to relapse include the passing of his mother, financial concerns and separation from his wife. The patient had been sober for 5 years but has relapsed over the past 24 months. The patient is drinking 12 pack beer daily but denies consumption of whisky and or wine. The patient denies any habitual use or dependence on opiates or benzodiazepines. The patient is also denying any polysubstance abuse. The patient does have a hx of DT's 11/2011 but denies hx of blackouts or seizures. The patient suffers from PTSD from his time spent serving in the [employer] with [employer] borne during [place]. The patient is also endorsing some depression sx rated 2-3/10. Depressive sx include insomnia and decreased concentration. The patient is denying any SI/SA/HI or AVH. The patient is also denying any paranoia or delusional thoughts. The patient is denying any family hx of alcoholism and or depression.  The patient is also denying any hx of anxiety d/o or panic attacks.   Past Psychiatric History: Past Medical History  Diagnosis Date  . Hypertension   . Herniated disc   . Hepatitis C   . COPD (chronic obstructive pulmonary disease)   . Chronic pain   . Chronic pain of left knee   . Cirrhosis     reports that he quit smoking about 10 months ago. He has quit using smokeless tobacco. He reports that he drinks alcohol. He reports that he uses illicit drugs (Cocaine). Family History  Problem Relation Age of Onset  . Cancer Mother      Living Arrangements: Spouse/significant other   Abuse/Neglect [facility]) Physical Abuse: Denies Verbal Abuse: Denies Sexual Abuse: Denies Allergies:   Allergies  Allergen Reactions  . Ketorolac Tromethamine Nausea Only    Burning at injection site x 3 days. Pt refused to take on 12/15/12, states it makes him "sick"    ACT Assessment Complete:  Yes:    Educational Status    Risk to Self:    Risk to Others:    Abuse: Abuse/Neglect Assessment (Assessment to be complete while patient is alone) Physical Abuse: Denies Verbal Abuse: Denies Sexual Abuse: Denies Exploitation of patient/patient's resources: Denies Self-Neglect: Denies  Prior Inpatient Therapy:    Prior Outpatient Therapy:    Additional Information:                    Objective: There were no vitals taken  for this visit.There is no weight on file to calculate BMI. Results for orders placed during the hospital encounter of 07/24/13 (from the past 72 hour(s))  URINE RAPID DRUG SCREEN (HOSP PERFORMED)     Status: Abnormal   Collection Time    07/24/13  2:13 PM      Result Value Ref Range   Opiates NONE DETECTED  NONE DETECTED   Cocaine POSITIVE (*) NONE DETECTED   Benzodiazepines POSITIVE (*) NONE DETECTED   Amphetamines NONE DETECTED  NONE DETECTED   Tetrahydrocannabinol NONE DETECTED  NONE DETECTED   Barbiturates NONE DETECTED  NONE DETECTED   Comment:             DRUG SCREEN FOR MEDICAL PURPOSES     ONLY.  IF CONFIRMATION IS NEEDED     FOR ANY PURPOSE, NOTIFY LAB     WITHIN 5 DAYS.                LOWEST DETECTABLE LIMITS     FOR URINE DRUG SCREEN     Drug Class       Cutoff (ng/mL)     Amphetamine      1000     Barbiturate      200     Benzodiazepine   588     Tricyclics       502     Opiates          300     Cocaine          300     THC              50  ACETAMINOPHEN LEVEL     Status: None   Collection Time    07/24/13  2:23 PM      Result Value Ref Range   Acetaminophen (Tylenol), Serum <15.0  10 - 30 ug/mL   Comment:            THERAPEUTIC CONCENTRATIONS VARY     SIGNIFICANTLY. A RANGE OF 10-30     ug/mL MAY BE AN EFFECTIVE     CONCENTRATION FOR MANY PATIENTS.     HOWEVER, SOME ARE BEST TREATED     AT CONCENTRATIONS OUTSIDE THIS     RANGE.     ACETAMINOPHEN CONCENTRATIONS     >150 ug/mL AT 4 HOURS AFTER     INGESTION AND >50 ug/mL AT 12     HOURS AFTER INGESTION ARE     OFTEN ASSOCIATED WITH TOXIC     REACTIONS.  CBC     Status: Abnormal   Collection Time    07/24/13  2:23 PM      Result Value Ref Range   WBC 8.0  4.0 - 10.5 K/uL   RBC 4.77  4.22 - 5.81 MIL/uL   Hemoglobin 15.9  13.0 - 17.0 g/dL   HCT 44.2  39.0 - 52.0 %   MCV 92.7  78.0 - 100.0 fL   MCH 33.3  26.0 - 34.0 pg   MCHC 36.0  30.0 - 36.0 g/dL   RDW 13.4  11.5 - 15.5 %   Platelets 103 (*) 150 - 400 K/uL   Comment: SPECIMEN CHECKED FOR CLOTS     PLATELET COUNT CONFIRMED BY SMEAR  COMPREHENSIVE METABOLIC PANEL     Status: Abnormal   Collection Time    07/24/13  2:23 PM      Result Value Ref Range   Sodium 134 (*) 137 - 147 mEq/L  Potassium 3.5 (*) 3.7 - 5.3 mEq/L   Chloride 98  96 - 112 mEq/L   CO2 20  19 - 32 mEq/L   Glucose, Bld 119 (*) 70 - 99 mg/dL   BUN 6  6 - 23 mg/dL   Creatinine, Ser 0.82  0.50 - 1.35 mg/dL   Calcium 9.6  8.4 - 10.5 mg/dL   Total Protein 8.2  6.0 - 8.3 g/dL   Albumin 3.7  3.5 - 5.2 g/dL   AST 135 (*) 0 - 37 U/L   ALT 96 (*)  0 - 53 U/L   Alkaline Phosphatase 124 (*) 39 - 117 U/L   Total Bilirubin 1.0  0.3 - 1.2 mg/dL   GFR calc non Af Amer >90  >90 mL/min   GFR calc Af Amer >90  >90 mL/min   Comment: (NOTE)     The eGFR has been calculated using the CKD EPI equation.     This calculation has not been validated in all clinical situations.     eGFR's persistently <90 mL/min signify possible Chronic Kidney     Disease.  ETHANOL     Status: Abnormal   Collection Time    07/24/13  2:23 PM      Result Value Ref Range   Alcohol, Ethyl (B) 57 (*) 0 - 11 mg/dL   Comment:            LOWEST DETECTABLE LIMIT FOR     SERUM ALCOHOL IS 11 mg/dL     FOR MEDICAL PURPOSES ONLY  SALICYLATE LEVEL     Status: Abnormal   Collection Time    07/24/13  2:23 PM      Result Value Ref Range   Salicylate Lvl <0.9 (*) 2.8 - 20.0 mg/dL   Labs are reviewed and are pertinent for Positive Alcohol screen and UDS positive for cocaine and benzodiazepines. LFT's elevated  Current Facility-Administered Medications  Medication Dose Route Frequency Provider Last Rate Last Dose  . albuterol (PROVENTIL HFA;VENTOLIN HFA) 108 (90 BASE) MCG/ACT inhaler 2 puff  2 puff Inhalation BID PRN Benjamine Mola, FNP      . alum & mag hydroxide-simeth (MAALOX/MYLANTA) 200-200-20 MG/5ML suspension 30 mL  30 mL Oral Q4H PRN Benjamine Mola, FNP      . [START ON 07/26/2013] amLODipine (NORVASC) tablet 10 mg  10 mg Oral Daily Benjamine Mola, FNP      . [START ON 07/26/2013] gabapentin (NEURONTIN) capsule 600 mg  600 mg Oral TID Benjamine Mola, FNP      . magnesium hydroxide (MILK OF MAGNESIA) suspension 30 mL  30 mL Oral Daily PRN Benjamine Mola, FNP      . [START ON 07/26/2013] multivitamin with minerals tablet 1 tablet  1 tablet Oral Daily Benjamine Mola, FNP      . [START ON 07/26/2013] propranolol (INDERAL) tablet 20 mg  20 mg Oral Daily Benjamine Mola, FNP        Psychiatric Specialty Exam:     There were no vitals taken for this visit.There is no weight on  file to calculate BMI.  General Appearance: Disheveled  Eye Contact::  Good  Speech:  Clear and Coherent  Volume:  Normal  Mood:  Hopeless  Affect:  Labile  Thought Process:  Circumstantial  Orientation:  Full (Time, Place, and Person)  Thought Content:  Negative  Suicidal Thoughts:  No  Homicidal Thoughts:  No  Memory:  Immediate;   Good  Judgement:  Impaired  Insight:  Lacking  Psychomotor Activity:  Tremor  Concentration:  Good  Recall:  Good  Fund of Knowledge:Good  Language: Good  Akathisia:  Negative  Handed:  Right  AIMS (if indicated):     Assets:  Desire for Improvement  Sleep:      Musculoskeletal: Strength & Muscle Tone: within normal limits Gait & Station: normal Patient leans: N/A  Treatment Plan Summary: Patient meeting criteria for crises mgmt, safety and stabilization of alcoholism induced mood d/o and PTSD Continued mgmt of chronic co morbidities Psychotropic intervention for mgmt of w/d sx and insomnia Possible transfer to [facility] PTSD program and continued substance abuse counseling  Laverle Hobby 07/25/2013 11:05 PM  I agreed with the findings, treatment and disposition plan of this patient. Berniece Andreas, MD

## 2013-07-25 NOTE — Progress Notes (Signed)
Patient ID: Victor Davidson, male   DOB: 04-30-1950, 63 y.o.   MRN: [id number]  Pt arrived to unit voluntarily for alcohol detox. Pt denies SI/HI or plans to harm himself. No distress noted. Pt states recent decline in health due to Hep C and Cirrhosis. Pt states he got into an argument with his wife after being discharged Friday from a detox program, "went to a hotel with another woman and got drunk and smoked some crack". Pt states he thinks his wife will allow him to come back home to live. Pt verbally contracts for safety. No inappropriate behaviors noted.

## 2013-07-25 NOTE — ED Notes (Signed)
Up to br without difficulty. [facility] [employer]

## 2013-07-25 NOTE — Progress Notes (Signed)
Patient ID: Victor Davidson, male   DOB: Jan 30, 1951, 63 y.o.   MRN: [id number] Pt declined to have family member contacted for Suicide Risk plan.

## 2013-07-25 NOTE — ED Notes (Signed)
Secretary recalled [place] to check on transport.

## 2013-07-25 NOTE — ED Notes (Signed)
Pt states he feels much better. Pt aware [facility] is [facility], and waiting on ride.

## 2013-07-26 DIAGNOSIS — F1010 Alcohol abuse, uncomplicated: Secondary | ICD-10-CM

## 2013-07-26 DIAGNOSIS — F4310 Post-traumatic stress disorder, unspecified: Secondary | ICD-10-CM

## 2013-07-26 MED ORDER — PROPRANOLOL HCL 20 MG PO TABS: 20.0000 mg | ORAL_TABLET | Freq: Every day | ORAL | Status: AC

## 2013-07-26 MED ORDER — ALBUTEROL SULFATE HFA 108 (90 BASE) MCG/ACT IN AERS: 2.0000 | INHALATION_SPRAY | Freq: Two times a day (BID) | RESPIRATORY_TRACT | Status: AC | PRN

## 2013-07-26 MED ORDER — GABAPENTIN 300 MG PO CAPS: 600.0000 mg | ORAL_CAPSULE | Freq: Three times a day (TID) | ORAL | Status: AC

## 2013-07-26 MED ORDER — TRAZODONE HCL 50 MG PO TABS: 50.0000 mg | ORAL_TABLET | Freq: Every evening | ORAL | Status: DC | PRN

## 2013-07-26 MED ORDER — AMLODIPINE BESYLATE 10 MG PO TABS: 10.0000 mg | ORAL_TABLET | Freq: Every day | ORAL | Status: AC

## 2013-07-26 NOTE — BH Assessment (Signed)
MHT spoke with Dr. Tresa MooreLauren Laymen at [facility] in [place]. MD informed MHT that she spoke with pt and since [place] [facility], [place] can get there on his own. MD stated that pt can be assessed for inpatient and optx programs. MHT will make the NP and [facility] aware at [facility].  Dossie Arbour-[place] Moss, MA  Disposition MHT
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
## 2013-07-26 NOTE — Progress Notes (Signed)
Patient ID: Victor Davidson, male   DOB: [birth year], 63 y.o.   MRN: [id number] Discharge Note-Conrad W NP evaluated client and agreed to discharge. He had previously made arrangements for himself to the [facility] for a two month alcohol rehab program.He had been sent here to complete his required 48 hours of detox before the [facility] program would accept him. He however does not have transportation back home to [place]. He came here to [facility] from [facility].His plan is for us to get him to [place] and he will drive himself to the [facility]. He has been on the phone multiple times to confirm his arrangements.He states he has an active drivers license and a vehicle to take him there.Refused his noon Librium so it wouldn't affect his ability to drive. Did take his noon Neurontin which he takes for pain.[facility] agreed to pay for a cab, arranged with [employer]and voucher given to driver.He is denying any thoughts to hurt self or others. He denies any psychotic symptoms.All of his belonging were returned to him.He was cooperative and pleasant with the discharge process.

## 2013-08-04 NOTE — Discharge Summary (Signed)
Case discussed, and agree with plan 

## 2013-08-26 IMAGING — CR DG KNEE COMPLETE 4+V*L*
4 series · 4 of 4 positions shown · non-contrast
Comparison: 10/05/2010

CLINICAL DATA: Knee pain and swelling.

LEFT KNEE - COMPLETE 4+ VIEW

[view not recorded (1 of 4)]
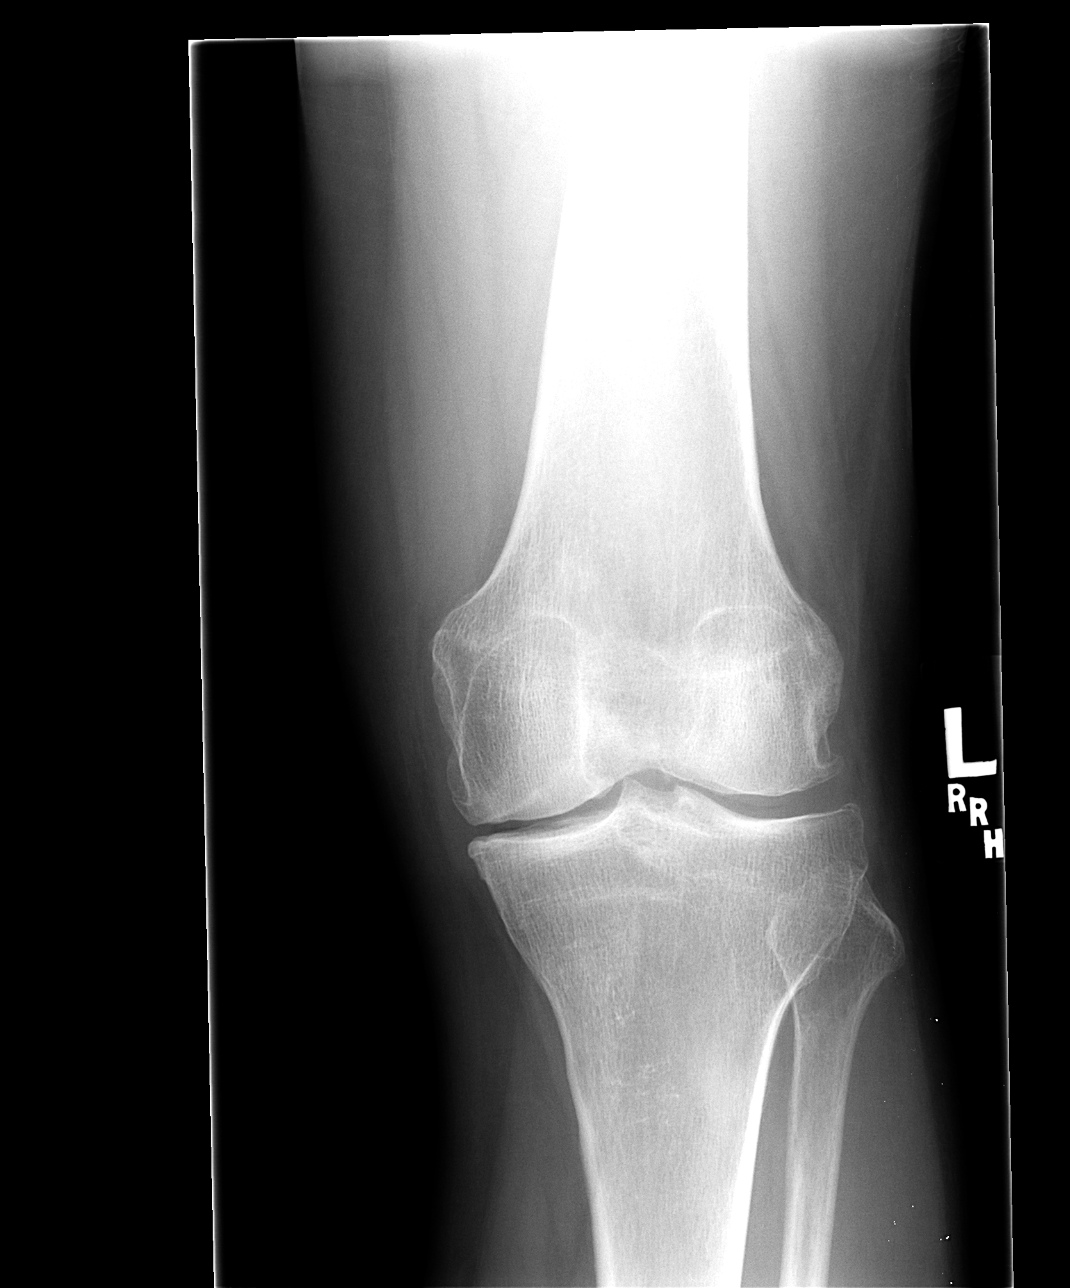

[view not recorded (2 of 4)]
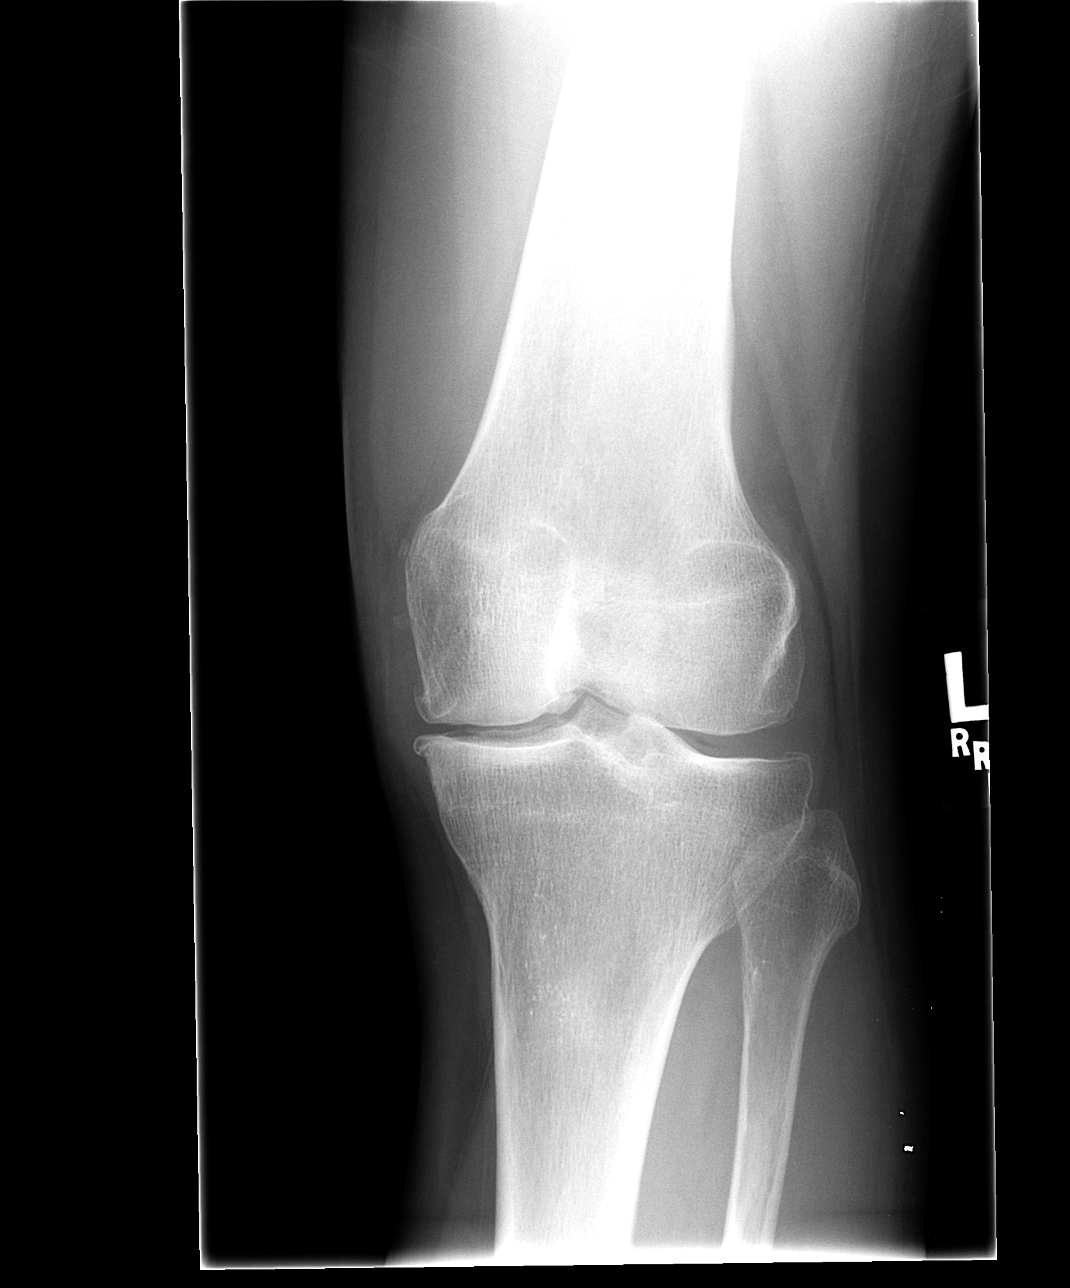

[view not recorded (3 of 4)]
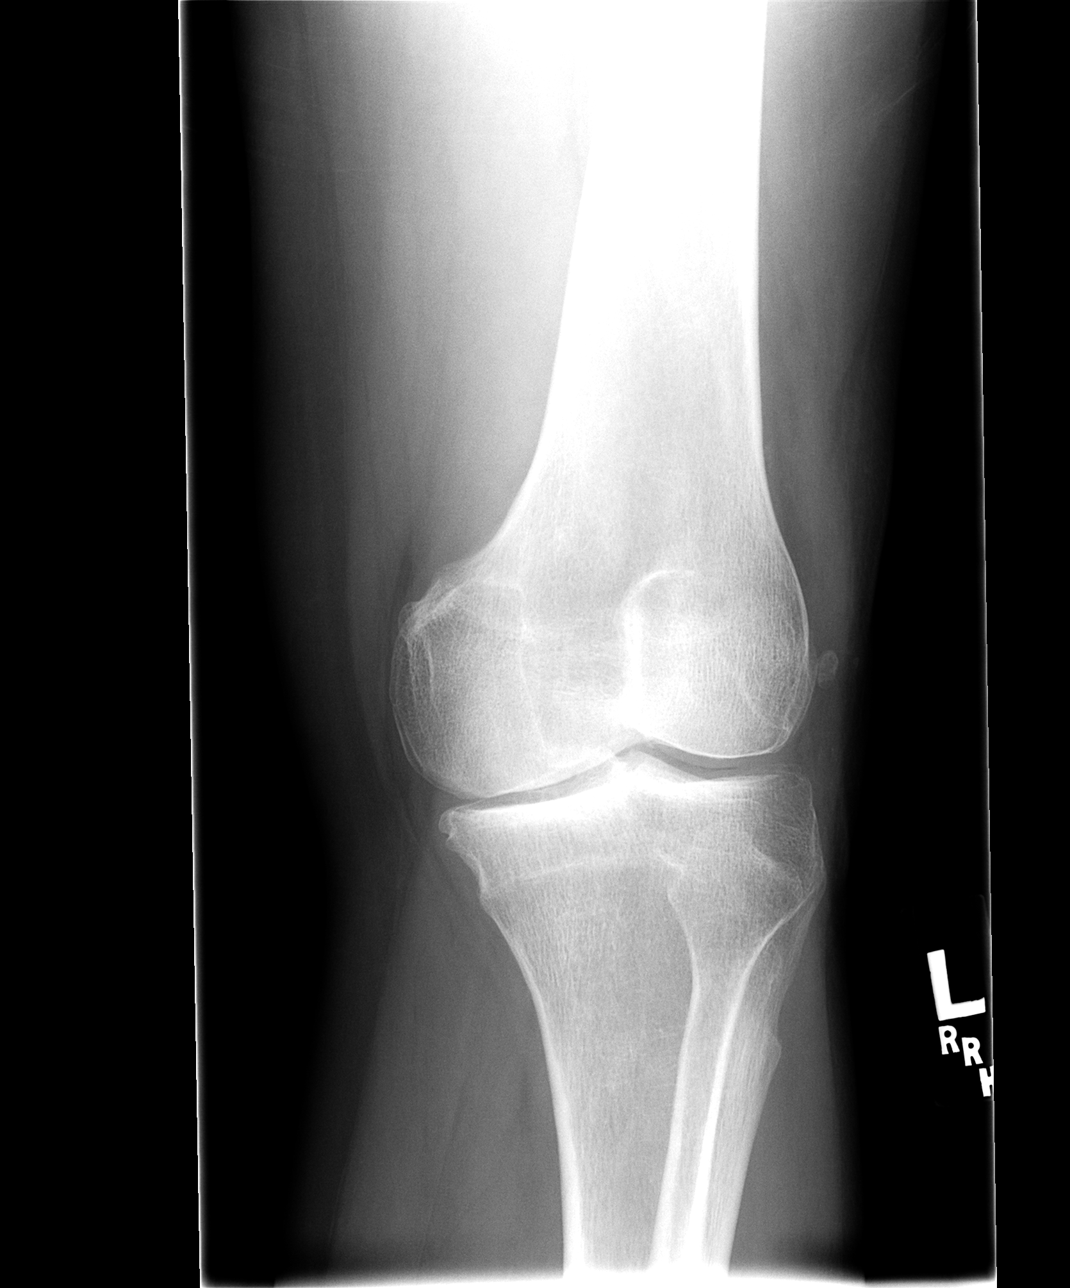

[view not recorded (4 of 4)]
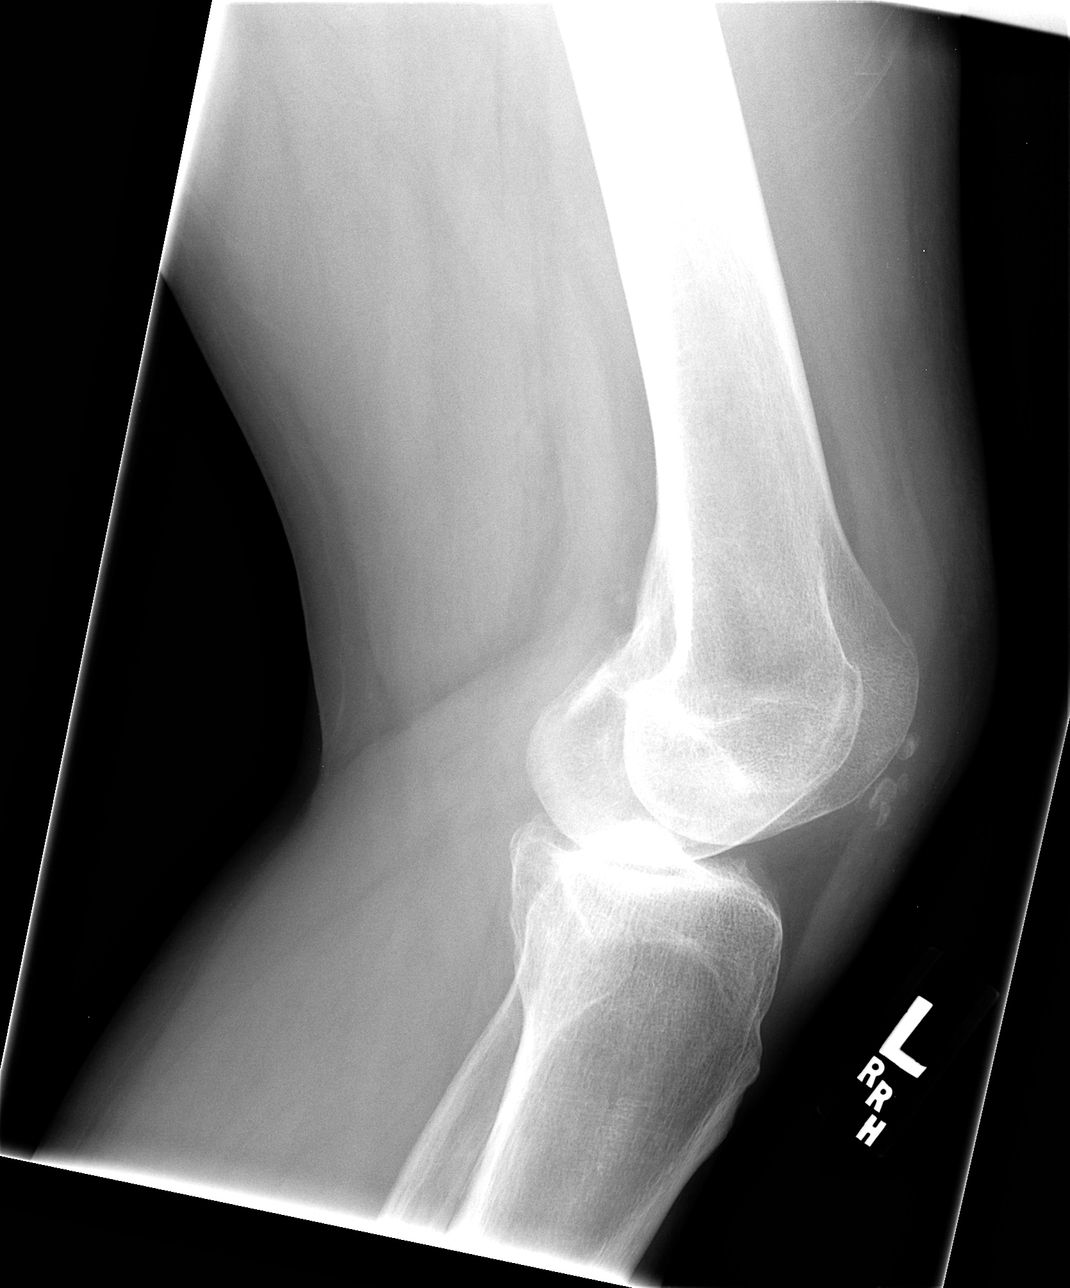

[4 of 4 positions shown; findings below may reference images not displayed]

FINDINGS: No evidence for an acute fracture.  No subluxation or
dislocation.  There is loss of joint space in medial compartment.
Degenerative spurring is seen in the medial and lateral joint
spaces.  The patella appears to be surgically absent, as before.
IMPRESSION: No acute bony findings.

## 2013-12-01 ENCOUNTER — Encounter (HOSPITAL_COMMUNITY): Payer: Self-pay | Admitting: Emergency Medicine

## 2013-12-01 ENCOUNTER — Emergency Department (HOSPITAL_COMMUNITY): Payer: Non-veteran care

## 2013-12-01 ENCOUNTER — Emergency Department (HOSPITAL_COMMUNITY)
Admission: EM | Admit: 2013-12-01 | Discharge: 2013-12-02 | Disposition: A | Discharge status: Home / Self Care | Payer: Non-veteran care | Attending: Emergency Medicine | Admitting: Emergency Medicine

## 2013-12-01 DIAGNOSIS — Z79899 Other long term (current) drug therapy: Secondary | ICD-10-CM | POA: Insufficient documentation

## 2013-12-01 DIAGNOSIS — G8929 Other chronic pain: Secondary | ICD-10-CM | POA: Insufficient documentation

## 2013-12-01 DIAGNOSIS — S0990XA Unspecified injury of head, initial encounter: Secondary | ICD-10-CM | POA: Insufficient documentation

## 2013-12-01 DIAGNOSIS — S060X9A Concussion with loss of consciousness of unspecified duration, initial encounter: Secondary | ICD-10-CM | POA: Diagnosis not present

## 2013-12-01 DIAGNOSIS — Y9289 Other specified places as the place of occurrence of the external cause: Secondary | ICD-10-CM | POA: Insufficient documentation

## 2013-12-01 DIAGNOSIS — Z8619 Personal history of other infectious and parasitic diseases: Secondary | ICD-10-CM | POA: Insufficient documentation

## 2013-12-01 DIAGNOSIS — Z96659 Presence of unspecified artificial knee joint: Secondary | ICD-10-CM | POA: Insufficient documentation

## 2013-12-01 DIAGNOSIS — S069X9A Unspecified intracranial injury with loss of consciousness of unspecified duration, initial encounter: Secondary | ICD-10-CM

## 2013-12-01 DIAGNOSIS — Z87891 Personal history of nicotine dependence: Secondary | ICD-10-CM | POA: Diagnosis not present

## 2013-12-01 DIAGNOSIS — Z8719 Personal history of other diseases of the digestive system: Secondary | ICD-10-CM | POA: Insufficient documentation

## 2013-12-01 DIAGNOSIS — F4310 Post-traumatic stress disorder, unspecified: Secondary | ICD-10-CM | POA: Insufficient documentation

## 2013-12-01 DIAGNOSIS — S01502A Unspecified open wound of oral cavity, initial encounter: Secondary | ICD-10-CM | POA: Diagnosis not present

## 2013-12-01 DIAGNOSIS — IMO0002 Reserved for concepts with insufficient information to code with codable children: Secondary | ICD-10-CM | POA: Insufficient documentation

## 2013-12-01 DIAGNOSIS — Y9389 Activity, other specified: Secondary | ICD-10-CM | POA: Insufficient documentation

## 2013-12-01 DIAGNOSIS — J4489 Other specified chronic obstructive pulmonary disease: Secondary | ICD-10-CM | POA: Insufficient documentation

## 2013-12-01 DIAGNOSIS — S01512A Laceration without foreign body of oral cavity, initial encounter: Secondary | ICD-10-CM

## 2013-12-01 DIAGNOSIS — I10 Essential (primary) hypertension: Secondary | ICD-10-CM | POA: Diagnosis not present

## 2013-12-01 DIAGNOSIS — J449 Chronic obstructive pulmonary disease, unspecified: Secondary | ICD-10-CM | POA: Insufficient documentation

## 2013-12-01 HISTORY — DX: Post-traumatic stress disorder, unspecified: F43.10

## 2013-12-01 MED ORDER — OXYCODONE HCL 5 MG PO TABS
5.0000 mg | ORAL_TABLET | Freq: Once | ORAL | Status: AC
  Administered 2013-12-01: 5 mg via ORAL
  Filled 2013-12-01: qty 1

## 2013-12-01 MED ORDER — OXYCODONE-ACETAMINOPHEN 5-325 MG PO TABS: 1.0000 | ORAL_TABLET | Freq: Once | ORAL | Status: DC
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
## 2013-12-02 MED ORDER — AMOXICILLIN 500 MG PO CAPS: 500.0000 mg | ORAL_CAPSULE | Freq: Three times a day (TID) | ORAL | Status: AC

## 2013-12-02 MED ORDER — OXYCODONE HCL 5 MG PO TABS: 5.0000 mg | ORAL_TABLET | ORAL | Status: DC | PRN

## 2013-12-02 MED ORDER — AMOXICILLIN 250 MG PO CAPS
500.0000 mg | ORAL_CAPSULE | Freq: Once | ORAL | Status: AC
  Administered 2013-12-02: 500 mg via ORAL
  Filled 2013-12-02: qty 2

## 2013-12-02 NOTE — Discharge Instructions (Signed)
Mouth Laceration A mouth laceration is a cut inside the mouth.  HOME CARE  Rinse your mouth with warm salt water 4 to 6 times a day.  Brush your teeth as usual if you can.  Do not eat hot food or have hot drinks while your mouth is still numb.  Avoid acidic foods or other foods that bother your cut.  Only take medicine as told by your doctor.  Keep all doctor visits as told.  If there are stitches (sutures) in the mouth, do not play with them with your tongue. You may need a tetanus shot if:  You cannot remember when you had your last tetanus shot.  You have never had a tetanus shot. If you need a tetanus shot and you choose not to have one, you may get tetanus. Sickness from tetanus can be serious. GET HELP RIGHT AWAY IF:   Your cut or other parts of your face are puffy (swollen) or painful.  You have a fever.  Your throat is puffy or tender.  Your cut breaks open after stitches have been removed.  You see yellowish-white fluid (pus) coming from the cut.   Head Injury You have received a head injury. It does not appear serious at this time. Headaches and vomiting are common following head injury. It should be easy to awaken from sleeping. Sometimes it is necessary for you to stay in the emergency department for a while for observation. Sometimes admission to the hospital may be needed. After injuries such as yours, most problems occur within the first 24 hours, but side effects may occur up to 7-10 days after the injury. It is important for you to carefully monitor your condition and contact your health care provider or seek immediate medical care if there is a change in your condition. WHAT ARE THE TYPES OF HEAD INJURIES? Head injuries can be as minor as a bump. Some head injuries can be more severe. More severe head injuries include:  A jarring injury to the brain (concussion).  A bruise of the brain (contusion). This mean there is bleeding in the brain that can cause  swelling.  A cracked skull (skull fracture).  Bleeding in the brain that collects, clots, and forms a bump (hematoma). WHAT CAUSES A HEAD INJURY? A serious head injury is most likely to happen to someone who is in a car wreck and is not wearing a seat belt. Other causes of major head injuries include bicycle or motorcycle accidents, sports injuries, and falls. HOW ARE HEAD INJURIES DIAGNOSED? A complete history of the event leading to the injury and your current symptoms will be helpful in diagnosing head injuries. Many times, pictures of the brain, such as CT or MRI are needed to see the extent of the injury. Often, an overnight hospital stay is necessary for observation.  WHEN SHOULD I SEEK IMMEDIATE MEDICAL CARE?  You should get help right away if:  You have confusion or drowsiness.  You feel sick to your stomach (nauseous) or have continued, forceful vomiting.  You have dizziness or unsteadiness that is getting worse.  You have severe, continued headaches not relieved by medicine. Only take over-the-counter or prescription medicines for pain, fever, or discomfort as directed by your health care provider.  You do not have normal function of the arms or legs or are unable to walk.  You notice changes in the black spots in the center of the colored part of your eye (pupil).  You have a clear or  bloody fluid coming from your nose or ears.  You have a loss of vision. During the next 24 hours after the injury, you must stay with someone who can watch you for the warning signs. This person should contact local emergency services (911 in the [place] if you have seizures, you become unconscious, or you are unable to wake up. HOW CAN I PREVENT A HEAD INJURY IN THE FUTURE? The most important factor for preventing major head injuries is avoiding motor vehicle accidents. To minimize the potential for damage to your head, it is crucial to wear seat belts while riding in motor vehicles. Wearing  helmets while bike riding and playing collision sports (like football) is also helpful. Also, avoiding dangerous activities around the house will further help reduce your risk of head injury.  WHEN CAN I RETURN TO NORMAL ACTIVITIES AND ATHLETICS? You should be reevaluated by your health care provider before returning to these activities. If you have any of the following symptoms, you should not return to activities or contact sports until 1 week after the symptoms have stopped:  Persistent headache.  Dizziness or vertigo.  Poor attention and concentration.  Confusion.  Memory problems.  Nausea or vomiting.  Fatigue or tire easily.  Irritability.  Intolerant of bright lights or loud noises.  Anxiety or depression.  Disturbed sleep. MAKE SURE YOU:   Understand these instructions.  Will watch your condition.  Will get help right away if you are not doing well or get worse. Document Released: 03/31/2005 Document Revised: 04/05/2013 Document Reviewed: 12/06/2012 [facility] Patient Information 2015 [place], [place]. This information is not intended to replace advice given to you by your health care provider. Make sure you discuss any questions you have with your health care provider.   Apply ice to your face which will help with pain and reduce swelling. Take the entire course of antibiotics to help prevent mouth infection while your wound heals.   You may take the oxycodone prescribed for pain relief.  This will make you drowsy - do not drive within 4 hours of taking this medication.
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
## 2013-12-04 ENCOUNTER — Emergency Department (HOSPITAL_COMMUNITY)
Admission: EM | Admit: 2013-12-04 | Discharge: 2013-12-04 | Disposition: A | Discharge status: Home / Self Care | Payer: Non-veteran care | Attending: Emergency Medicine | Admitting: Emergency Medicine

## 2013-12-04 ENCOUNTER — Encounter (HOSPITAL_COMMUNITY): Payer: Self-pay | Admitting: Emergency Medicine

## 2013-12-04 DIAGNOSIS — Z8659 Personal history of other mental and behavioral disorders: Secondary | ICD-10-CM | POA: Insufficient documentation

## 2013-12-04 DIAGNOSIS — Z96649 Presence of unspecified artificial hip joint: Secondary | ICD-10-CM | POA: Insufficient documentation

## 2013-12-04 DIAGNOSIS — Z79899 Other long term (current) drug therapy: Secondary | ICD-10-CM | POA: Diagnosis not present

## 2013-12-04 DIAGNOSIS — Z8619 Personal history of other infectious and parasitic diseases: Secondary | ICD-10-CM | POA: Insufficient documentation

## 2013-12-04 DIAGNOSIS — Z792 Long term (current) use of antibiotics: Secondary | ICD-10-CM | POA: Diagnosis not present

## 2013-12-04 DIAGNOSIS — G8911 Acute pain due to trauma: Secondary | ICD-10-CM | POA: Diagnosis not present

## 2013-12-04 DIAGNOSIS — I10 Essential (primary) hypertension: Secondary | ICD-10-CM | POA: Diagnosis not present

## 2013-12-04 DIAGNOSIS — J449 Chronic obstructive pulmonary disease, unspecified: Secondary | ICD-10-CM | POA: Insufficient documentation

## 2013-12-04 DIAGNOSIS — IMO0002 Reserved for concepts with insufficient information to code with codable children: Secondary | ICD-10-CM | POA: Insufficient documentation

## 2013-12-04 DIAGNOSIS — M25559 Pain in unspecified hip: Secondary | ICD-10-CM | POA: Insufficient documentation

## 2013-12-04 DIAGNOSIS — G8929 Other chronic pain: Secondary | ICD-10-CM | POA: Insufficient documentation

## 2013-12-04 DIAGNOSIS — Z87891 Personal history of nicotine dependence: Secondary | ICD-10-CM | POA: Insufficient documentation

## 2013-12-04 DIAGNOSIS — S7001XD Contusion of right hip, subsequent encounter: Secondary | ICD-10-CM

## 2013-12-04 DIAGNOSIS — J4489 Other specified chronic obstructive pulmonary disease: Secondary | ICD-10-CM | POA: Insufficient documentation

## 2013-12-04 DIAGNOSIS — Z8719 Personal history of other diseases of the digestive system: Secondary | ICD-10-CM | POA: Insufficient documentation

## 2013-12-04 MED ORDER — OXYCODONE HCL 5 MG PO TABS: 5.0000 mg | ORAL_TABLET | ORAL | Status: DC | PRN

## 2013-12-04 MED ORDER — OXYCODONE-ACETAMINOPHEN 5-325 MG PO TABS
2.0000 | ORAL_TABLET | Freq: Once | ORAL | Status: AC
  Administered 2013-12-04: 2 via ORAL
  Filled 2013-12-04: qty 2

## 2013-12-04 NOTE — Discharge Instructions (Signed)
I have reviewed your xrays from last week - they show no fractures of the hip or pelvis - your pain and swelling may take several weeks to improve - please see your doctor for ongoing pain control.  [facility] Primary Care Doctor List    Kari Baars MD. Specialty: Pulmonary Disease Contact information: [address]  [address]  [place] [address]  [phone]   Syliva Overman, MD. Specialty: [facility] Medicine Contact information: [address]  [place] [address]  [phone]   Lilyan Punt, MD. Specialty: [facility] Medicine Contact information: [address]  [place] [address]  [phone]   Avon Gully, MD Specialty: Internal Medicine Contact information: [address] [place] [address]  [phone]   Catalina Pizza, MD. Specialty: Internal Medicine Contact information: [address]  [place] [address]  [phone]   Butch Penny, MD. Specialty: Family Medicine Contact information: [address]  [place] [address]  [phone]   John Giovanni, MD. Specialty: [facility] Medicine Contact information: [address]  [address]  [place] [address]  [phone]   Carylon Perches, MD. Specialty: Internal Medicine Contact information: [address]  [address]  [place] [address]  [phone]
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
## 2013-12-12 NOTE — ED Provider Notes (Signed)
Medical screening examination/treatment/procedure(s) were performed by non-physician practitioner and as supervising physician I was immediately available for consultation/collaboration.   EKG Interpretation None        Waneta Fitting, MD 12/12/13 1130 

## 2014-01-06 ENCOUNTER — Encounter (HOSPITAL_COMMUNITY): Payer: Self-pay | Admitting: Emergency Medicine

## 2014-01-06 ENCOUNTER — Emergency Department (HOSPITAL_COMMUNITY)
Admission: EM | Admit: 2014-01-06 | Discharge: 2014-01-06 | Disposition: A | Discharge status: Home / Self Care | Payer: Non-veteran care | Attending: Emergency Medicine | Admitting: Emergency Medicine

## 2014-01-06 DIAGNOSIS — F4310 Post-traumatic stress disorder, unspecified: Secondary | ICD-10-CM | POA: Insufficient documentation

## 2014-01-06 DIAGNOSIS — Z96649 Presence of unspecified artificial hip joint: Secondary | ICD-10-CM | POA: Diagnosis not present

## 2014-01-06 DIAGNOSIS — Z8619 Personal history of other infectious and parasitic diseases: Secondary | ICD-10-CM | POA: Diagnosis not present

## 2014-01-06 DIAGNOSIS — X503XXA Overexertion from repetitive movements, initial encounter: Secondary | ICD-10-CM | POA: Insufficient documentation

## 2014-01-06 DIAGNOSIS — M5136 Other intervertebral disc degeneration, lumbar region: Secondary | ICD-10-CM

## 2014-01-06 DIAGNOSIS — Z8719 Personal history of other diseases of the digestive system: Secondary | ICD-10-CM | POA: Insufficient documentation

## 2014-01-06 DIAGNOSIS — S39012A Strain of muscle, fascia and tendon of lower back, initial encounter: Secondary | ICD-10-CM

## 2014-01-06 DIAGNOSIS — IMO0002 Reserved for concepts with insufficient information to code with codable children: Secondary | ICD-10-CM | POA: Insufficient documentation

## 2014-01-06 DIAGNOSIS — I10 Essential (primary) hypertension: Secondary | ICD-10-CM | POA: Diagnosis not present

## 2014-01-06 DIAGNOSIS — J449 Chronic obstructive pulmonary disease, unspecified: Secondary | ICD-10-CM | POA: Diagnosis not present

## 2014-01-06 DIAGNOSIS — Z79899 Other long term (current) drug therapy: Secondary | ICD-10-CM | POA: Insufficient documentation

## 2014-01-06 DIAGNOSIS — M51379 Other intervertebral disc degeneration, lumbosacral region without mention of lumbar back pain or lower extremity pain: Secondary | ICD-10-CM | POA: Insufficient documentation

## 2014-01-06 DIAGNOSIS — Z87891 Personal history of nicotine dependence: Secondary | ICD-10-CM | POA: Insufficient documentation

## 2014-01-06 DIAGNOSIS — Y9289 Other specified places as the place of occurrence of the external cause: Secondary | ICD-10-CM | POA: Diagnosis not present

## 2014-01-06 DIAGNOSIS — M5137 Other intervertebral disc degeneration, lumbosacral region: Secondary | ICD-10-CM | POA: Diagnosis not present

## 2014-01-06 DIAGNOSIS — X500XXA Overexertion from strenuous movement or load, initial encounter: Secondary | ICD-10-CM | POA: Insufficient documentation

## 2014-01-06 DIAGNOSIS — Y9389 Activity, other specified: Secondary | ICD-10-CM | POA: Insufficient documentation

## 2014-01-06 DIAGNOSIS — S335XXA Sprain of ligaments of lumbar spine, initial encounter: Secondary | ICD-10-CM | POA: Insufficient documentation

## 2014-01-06 DIAGNOSIS — J4489 Other specified chronic obstructive pulmonary disease: Secondary | ICD-10-CM | POA: Insufficient documentation

## 2014-01-06 DIAGNOSIS — G8929 Other chronic pain: Secondary | ICD-10-CM | POA: Insufficient documentation

## 2014-01-06 MED ORDER — HYDROCODONE-ACETAMINOPHEN 5-325 MG PO TABS
ORAL_TABLET | ORAL | Status: AC
  Filled 2014-01-06: qty 1

## 2014-01-06 MED ORDER — CYCLOBENZAPRINE HCL 10 MG PO TABS
10.0000 mg | ORAL_TABLET | Freq: Once | ORAL | Status: AC
  Administered 2014-01-06: 10 mg via ORAL
  Filled 2014-01-06: qty 1

## 2014-01-06 MED ORDER — CYCLOBENZAPRINE HCL 10 MG PO TABS: 10.0000 mg | ORAL_TABLET | Freq: Two times a day (BID) | ORAL | Status: AC | PRN

## 2014-01-06 MED ORDER — PREDNISONE 50 MG PO TABS
60.0000 mg | ORAL_TABLET | Freq: Once | ORAL | Status: AC
  Administered 2014-01-06: 60 mg via ORAL
  Filled 2014-01-06 (×2): qty 1

## 2014-01-06 MED ORDER — HYDROCODONE-ACETAMINOPHEN 7.5-325 MG PO TABS: 1.0000 | ORAL_TABLET | ORAL | Status: DC | PRN

## 2014-01-06 MED ORDER — HYDROCODONE-ACETAMINOPHEN 5-325 MG PO TABS
2.0000 | ORAL_TABLET | Freq: Once | ORAL | Status: AC
  Administered 2014-01-06: 2 via ORAL
  Filled 2014-01-06: qty 1

## 2014-01-06 NOTE — ED Provider Notes (Signed)
Medical screening examination/treatment/procedure(s) were performed by non-physician practitioner and as supervising physician I was immediately available for consultation/collaboration.   EKG Interpretation None       Evea Sheek, MD 01/06/14 2014 

## 2014-01-06 NOTE — ED Provider Notes (Signed)
CSN: [id number]     Arrival date & time 01/06/14  1741 History   First MD Initiated Contact with Patient 01/06/14 1840     Chief Complaint  Patient presents with  . Back Pain     (Consider location/radiation/quality/duration/timing/severity/associated sxs/prior Treatment) HPI Comments: Patient is a 63 year old male who presents to the emergency department with complaint of back pain. The patient was working on her tractor today when a very heavy piece of machinery fell next to him. He had to push his body to get around the heavy piece of equipment and also had to maneuver around a large tractor tire. In the course of this he injured his lower back. The patient states that he has a history of degenerative disc problems, and feels he reinjured his back. There was no loss of bowel or bladder function. The patient has been able to ambulate since the problem, but pain continues to get gradually worse. He has not taken any medication for this since the incident.  Patient is a 63 y.o. male presenting with back pain. The history is provided by the patient.  Back Pain Associated symptoms: no abdominal pain, no chest pain and no dysuria     Past Medical History  Diagnosis Date  . Hypertension   . Herniated disc   . Hepatitis C   . COPD (chronic obstructive pulmonary disease)   . Chronic pain   . Chronic pain of left knee   . Cirrhosis   . PTSD (post-traumatic stress disorder)    Past Surgical History  Procedure Laterality Date  . Knee surgery    . Total hip arthroplasty    . Tonsillectomy    . Joint replacement    . Ankle surgery     Family History  Problem Relation Age of Onset  . Cancer Mother    History  Substance Use Topics  . Smoking status: Former Smoker -- 0.50 packs/day for 30 years    Quit date: 09/02/2012  . Smokeless tobacco: Former Neurosurgeon  . Alcohol Use: Yes     Comment: daily use    Review of Systems  Constitutional: Negative for activity change.       All ROS Neg  except as noted in HPI  Eyes: Negative for photophobia and discharge.  Respiratory: Negative for cough, shortness of breath and wheezing.   Cardiovascular: Negative for chest pain and palpitations.  Gastrointestinal: Negative for abdominal pain and blood in stool.  Genitourinary: Negative for dysuria, frequency and hematuria.  Musculoskeletal: Positive for arthralgias and back pain. Negative for neck pain.  Skin: Negative.   Neurological: Negative for dizziness, seizures and speech difficulty.  Psychiatric/Behavioral: Negative for hallucinations and confusion.      Allergies  Ketorolac tromethamine  Home Medications   Prior to Admission medications   Medication Sig Start Date End Date Taking? Authorizing Provider  albuterol (PROVENTIL HFA;VENTOLIN HFA) 108 (90 BASE) MCG/ACT inhaler Inhale 2 puffs into the lungs 2 (two) times daily as needed for wheezing or shortness of breath. 07/26/13   Beau Fanny, FNP  amLODipine (NORVASC) 10 MG tablet Take 1 tablet (10 mg total) by mouth daily. 07/26/13   Beau Fanny, FNP  budesonide-formoterol (SYMBICORT) 160-4.5 MCG/ACT inhaler Inhale 2 puffs into the lungs 2 (two) times daily.    Historical Provider, MD  gabapentin (NEURONTIN) 300 MG capsule Take 2 capsules (600 mg total) by mouth 3 (three) times daily. For nerve pain 07/26/13   Beau Fanny, FNP  loratadine (CLARITIN) 10  MG tablet Take 10 mg by mouth daily as needed for itching.    Historical Provider, MD  oxyCODONE (ROXICODONE) 5 MG immediate release tablet Take 1 tablet (5 mg total) by mouth every 4 (four) hours as needed for severe pain. 12/02/13   Burgess Amor, PA-C  oxyCODONE (ROXICODONE) 5 MG immediate release tablet Take 1 tablet (5 mg total) by mouth every 4 (four) hours as needed for severe pain. 12/04/13   Vida Roller, MD  propranolol (INDERAL) 20 MG tablet Take 1 tablet (20 mg total) by mouth daily. 07/26/13   Beau Fanny, FNP  traZODone (DESYREL) 50 MG tablet Take 1 tablet (50  mg total) by mouth at bedtime and may repeat dose one time if needed. 07/26/13   Everardo All Withrow, FNP   BP 133/74  Pulse 67  Temp(Src) 98.7 F (37.1 C) (Oral)  Ht  (1.676 m)  Wt 160 lb (72.576 kg)  BMI 25.84 kg/m2  SpO2 99% Physical Exam  Nursing note and vitals reviewed. Constitutional: He is oriented to person, place, and time. He appears well-developed and well-nourished.  Non-toxic appearance.  HENT:  Head: Normocephalic.  Right Ear: Tympanic membrane and external ear normal.  Left Ear: Tympanic membrane and external ear normal.  Eyes: EOM and lids are normal. Pupils are equal, round, and reactive to light.  Neck: Normal range of motion. Neck supple. Carotid bruit is not present.  Cardiovascular: Normal rate, regular rhythm, normal heart sounds, intact distal pulses and normal pulses.   Pulmonary/Chest: Breath sounds normal. No respiratory distress.  No bruising noted of the chest wall. There is symmetrical rise and fall of the chest. The patient speaks in complete sentences without problem.  Abdominal: Soft. Bowel sounds are normal. There is no tenderness. There is no guarding.  Musculoskeletal:       Lumbar back: He exhibits decreased range of motion, tenderness, pain and spasm.       Back:  Lymphadenopathy:       Head (right side): No submandibular adenopathy present.       Head (left side): No submandibular adenopathy present.    He has no cervical adenopathy.  Neurological: He is alert and oriented to person, place, and time. He has normal strength. No cranial nerve deficit or sensory deficit.  Skin: Skin is warm and dry.  Psychiatric: He has a normal mood and affect. His speech is normal.    ED Course  Procedures (including critical care time) Labs Review Labs Reviewed - No data to display  Imaging Review No results found.   EKG Interpretation None      MDM  Review of previous emergency department visits as well as imaging reveals the patient has a  history of degenerative disc disease. Vital signs at this time are within normal limits. There no gross neurologic deficits appreciated. Examination is consistent with lumbar strain, aggravated by chronic degenerative disc disease .  Plan at this time is for the patient be treated with Flexeril and Norco 7.5. Patient will follow up with his physicians at the [facility] next week.    Final diagnoses:  None    **I have reviewed nursing notes, vital signs, and all appropriate lab and imaging results for this patient. A    Kathie Dike, PA-C 01/06/14 [phone]

## 2014-01-06 NOTE — ED Notes (Signed)
A PTO shaft (per pt around 300lb)  fell on pt's chest today while working on tractor, c/o back pain

## 2014-01-06 NOTE — Discharge Instructions (Signed)

## 2014-02-14 ENCOUNTER — Emergency Department (HOSPITAL_COMMUNITY)
Admission: EM | Admit: 2014-02-14 | Discharge: 2014-02-14 | Disposition: A | Discharge status: Home / Self Care | Payer: Non-veteran care | Attending: Emergency Medicine | Admitting: Emergency Medicine

## 2014-02-14 ENCOUNTER — Encounter (HOSPITAL_COMMUNITY): Payer: Self-pay | Admitting: *Deleted

## 2014-02-14 ENCOUNTER — Emergency Department (HOSPITAL_COMMUNITY): Payer: Non-veteran care

## 2014-02-14 DIAGNOSIS — Z8719 Personal history of other diseases of the digestive system: Secondary | ICD-10-CM | POA: Diagnosis not present

## 2014-02-14 DIAGNOSIS — W11XXXA Fall on and from ladder, initial encounter: Secondary | ICD-10-CM | POA: Insufficient documentation

## 2014-02-14 DIAGNOSIS — I10 Essential (primary) hypertension: Secondary | ICD-10-CM | POA: Insufficient documentation

## 2014-02-14 DIAGNOSIS — Y9389 Activity, other specified: Secondary | ICD-10-CM | POA: Diagnosis not present

## 2014-02-14 DIAGNOSIS — Y929 Unspecified place or not applicable: Secondary | ICD-10-CM | POA: Insufficient documentation

## 2014-02-14 DIAGNOSIS — S60211A Contusion of right wrist, initial encounter: Secondary | ICD-10-CM | POA: Diagnosis not present

## 2014-02-14 DIAGNOSIS — G8929 Other chronic pain: Secondary | ICD-10-CM | POA: Diagnosis not present

## 2014-02-14 DIAGNOSIS — Z87891 Personal history of nicotine dependence: Secondary | ICD-10-CM | POA: Insufficient documentation

## 2014-02-14 DIAGNOSIS — Z8739 Personal history of other diseases of the musculoskeletal system and connective tissue: Secondary | ICD-10-CM | POA: Diagnosis not present

## 2014-02-14 DIAGNOSIS — J441 Chronic obstructive pulmonary disease with (acute) exacerbation: Secondary | ICD-10-CM | POA: Diagnosis not present

## 2014-02-14 DIAGNOSIS — F4310 Post-traumatic stress disorder, unspecified: Secondary | ICD-10-CM | POA: Insufficient documentation

## 2014-02-14 DIAGNOSIS — S6991XA Unspecified injury of right wrist, hand and finger(s), initial encounter: Secondary | ICD-10-CM | POA: Diagnosis present

## 2014-02-14 DIAGNOSIS — Z79899 Other long term (current) drug therapy: Secondary | ICD-10-CM | POA: Insufficient documentation

## 2014-02-14 DIAGNOSIS — R52 Pain, unspecified: Secondary | ICD-10-CM

## 2014-02-14 MED ORDER — HYDROCODONE-ACETAMINOPHEN 7.5-325 MG PO TABS: 1.0000 | ORAL_TABLET | ORAL | Status: AC | PRN

## 2014-02-14 MED ORDER — HYDROCODONE-ACETAMINOPHEN 5-325 MG PO TABS
2.0000 | ORAL_TABLET | Freq: Once | ORAL | Status: AC
Start: 2014-02-14 — End: 2014-02-14
  Administered 2014-02-14: 2 via ORAL
  Filled 2014-02-14: qty 2

## 2014-02-14 NOTE — ED Notes (Signed)
Pt fell from ladder about 3 feet high, went to catch self with fall, c/o right wrist pain

## 2014-02-14 NOTE — Discharge Instructions (Signed)

## 2014-02-14 NOTE — ED Notes (Signed)
Ice pack provided and velcro wrist splint applied to right wrist.

## 2014-02-14 NOTE — ED Provider Notes (Signed)
CSN: [id number]     Arrival date & time 02/14/14  1628 History   First MD Initiated Contact with Patient 02/14/14 1701     Chief Complaint  Patient presents with  . Fall     (Consider location/radiation/quality/duration/timing/severity/associated sxs/prior Treatment) Patient is a 63 y.o. male presenting with wrist pain. The history is provided by the patient. No language interpreter was used.  Wrist Pain This is a new problem. The current episode started today. The problem has been unchanged. Pertinent negatives include no numbness. Nothing aggravates the symptoms. He has tried nothing for the symptoms. The treatment provided no relief.  Pt fell off of a ladder and hit right wrist.  (A pot bellied pig ran under his ladder causing him to fall.   Pt reports he does not have a pig and does not know where it came from)  Pt was very startled. Pt denies any other areas of injury.  No impact to head or back.   Wrist has been swelling since impact.   Past Medical History  Diagnosis Date  . Hypertension   . Herniated disc   . Hepatitis C   . COPD (chronic obstructive pulmonary disease)   . Chronic pain   . Chronic pain of left knee   . Cirrhosis   . PTSD (post-traumatic stress disorder)    Past Surgical History  Procedure Laterality Date  . Knee surgery    . Total hip arthroplasty    . Tonsillectomy    . Joint replacement    . Ankle surgery     Family History  Problem Relation Age of Onset  . Cancer Mother    History  Substance Use Topics  . Smoking status: Former Smoker -- 0.50 packs/day for 30 years    Quit date: 09/02/2012  . Smokeless tobacco: Former NeurosurgeonUser  . Alcohol Use: No     Comment: denies use 02/14/14    Review of Systems  Neurological: Negative for numbness.  All other systems reviewed and are negative.     Allergies  Ketorolac tromethamine  Home Medications   Prior to Admission medications   Medication Sig Start Date End Date Taking? Authorizing Provider   albuterol (PROVENTIL HFA;VENTOLIN HFA) 108 (90 BASE) MCG/ACT inhaler Inhale 2 puffs into the lungs 2 (two) times daily as needed for wheezing or shortness of breath. 07/26/13   Beau FannyJohn C Withrow, FNP  amLODipine (NORVASC) 10 MG tablet Take 1 tablet (10 mg total) by mouth daily. 07/26/13   Beau FannyJohn C Withrow, FNP  budesonide-formoterol (SYMBICORT) 160-4.5 MCG/ACT inhaler Inhale 2 puffs into the lungs 2 (two) times daily.    Historical Provider, MD  cyclobenzaprine (FLEXERIL) 10 MG tablet Take 1 tablet (10 mg total) by mouth 2 (two) times daily as needed for muscle spasms. 01/06/14   Kathie DikeHobson M Bryant, PA-C  gabapentin (NEURONTIN) 300 MG capsule Take 2 capsules (600 mg total) by mouth 3 (three) times daily. For nerve pain 07/26/13   Beau FannyJohn C Withrow, FNP  HYDROcodone-acetaminophen (NORCO) 7.5-325 MG per tablet Take 1 tablet by mouth every 4 (four) hours as needed. 01/06/14   Kathie DikeHobson M Bryant, PA-C  loratadine (CLARITIN) 10 MG tablet Take 10 mg by mouth daily as needed for itching.    Historical Provider, MD  propranolol (INDERAL) 20 MG tablet Take 1 tablet (20 mg total) by mouth daily. 07/26/13   Beau FannyJohn C Withrow, FNP  traZODone (DESYREL) 50 MG tablet Take 25 mg by mouth at bedtime.    Historical Provider, MD  BP 168/83 mmHg  Pulse 74  Temp(Src) 99.7 F (37.6 C) (Oral)  Ht 5\' 6"  (1.676 m)  Wt 158 lb (71.668 kg)  BMI 25.51 kg/m2  SpO2 100% Physical Exam  Constitutional: He is oriented to person, place, and time. He appears well-developed and well-nourished.  HENT:  Head: Normocephalic.  Cardiovascular: Normal rate.   Pulmonary/Chest: Effort normal. He has wheezes.  Musculoskeletal:  Tender risth wrist,  Decreased range of motion,. nv and ns intact  Neurological: He is alert and oriented to person, place, and time. He has normal reflexes.  Skin: Skin is warm.  Psychiatric: He has a normal mood and affect.  Nursing note and vitals reviewed.   ED Course  Procedures (including critical care time) Labs  Review Labs Reviewed - No data to display  Imaging Review Dg Wrist Complete Right  02/14/2014   CLINICAL DATA:  Status post fall from deer stand this morning now with right wrist pain  EXAM: RIGHT WRIST - COMPLETE 3+ VIEW  COMPARISON:  None.  FINDINGS: The bones of the right wrist are adequately mineralized for age. There is mild degenerative change of the first carpometacarpal joint. The scaphoid is intact. The radiocarpal joint is normal. The metacarpals are intact where visualized.  IMPRESSION: There is no acute bony abnormality of the right wrist.   Electronically Signed   By: David  Swazil[place]   On: 02/14/2014 17:22     EKG Interpretation None      MDM   Final diagnoses:  Wrist contusion, right, initial encounter    Hydrocodone Splint See your Physicain for recheck in 1 week     Elson AreasLeslie K Liberta Gimpel, PA-C 02/14/14 1745

## 2014-02-16 ENCOUNTER — Emergency Department (HOSPITAL_COMMUNITY)
Admission: EM | Admit: 2014-02-16 | Discharge: 2014-02-16 | Disposition: A | Discharge status: Home / Self Care | Payer: Non-veteran care | Attending: Emergency Medicine | Admitting: Emergency Medicine

## 2014-02-16 ENCOUNTER — Encounter (HOSPITAL_COMMUNITY): Payer: Self-pay | Admitting: Emergency Medicine

## 2014-02-16 DIAGNOSIS — Z87891 Personal history of nicotine dependence: Secondary | ICD-10-CM | POA: Insufficient documentation

## 2014-02-16 DIAGNOSIS — I10 Essential (primary) hypertension: Secondary | ICD-10-CM | POA: Insufficient documentation

## 2014-02-16 DIAGNOSIS — Z8719 Personal history of other diseases of the digestive system: Secondary | ICD-10-CM | POA: Insufficient documentation

## 2014-02-16 DIAGNOSIS — M25531 Pain in right wrist: Secondary | ICD-10-CM | POA: Insufficient documentation

## 2014-02-16 DIAGNOSIS — J449 Chronic obstructive pulmonary disease, unspecified: Secondary | ICD-10-CM | POA: Insufficient documentation

## 2014-02-16 DIAGNOSIS — Z79899 Other long term (current) drug therapy: Secondary | ICD-10-CM | POA: Insufficient documentation

## 2014-02-16 DIAGNOSIS — Z8619 Personal history of other infectious and parasitic diseases: Secondary | ICD-10-CM | POA: Insufficient documentation

## 2014-02-16 DIAGNOSIS — G8929 Other chronic pain: Secondary | ICD-10-CM | POA: Insufficient documentation

## 2014-02-16 DIAGNOSIS — F4310 Post-traumatic stress disorder, unspecified: Secondary | ICD-10-CM | POA: Insufficient documentation

## 2014-02-16 MED ORDER — OXYCODONE-ACETAMINOPHEN 5-325 MG PO TABS: 1.0000 | ORAL_TABLET | ORAL | Status: AC | PRN

## 2014-02-16 NOTE — ED Notes (Addendum)
Patient c/o right hand pain. Per patient coming down from deer stand on Tuesday and fell to ground. Patient reports being seen here and had x-ray. Velcro splint in place. Patient reports pain and swelling is getting progressively worse despite using ice pack and elevation.

## 2014-02-16 NOTE — Discharge Instructions (Signed)

## 2014-02-17 NOTE — ED Provider Notes (Signed)
CSN: [id number]     Arrival date & time 02/16/14  [id number] History   First MD Initiated Contact with Patient 02/16/14 0902     Chief Complaint  Patient presents with  . Hand Pain     (Consider location/radiation/quality/duration/timing/severity/associated sxs/prior Treatment) HPI   Victor Davidson is a 63 y.o. male who presents to the Emergency Department complaining of continued pain and to right wrist.  Patient was seen here two days ago for same that began after a fall, landing on his hands.  Wrist pain is worse with movement of the wrist, improves with wrist support.  He also reports swelling of his hand.  He denies numbness of his hand or fingers.  States he has ran out of the pain medications he was given on previus visit.  [facility] and is waiting for [facility].     Past Medical History  Diagnosis Date  . Hypertension   . Herniated disc   . Hepatitis C   . COPD (chronic obstructive pulmonary disease)   . Chronic pain   . Chronic pain of left knee   . Cirrhosis   . PTSD (post-traumatic stress disorder)    Past Surgical History  Procedure Laterality Date  . Knee surgery    . Total hip arthroplasty    . Tonsillectomy    . Joint replacement    . Ankle surgery     Family History  Problem Relation Age of Onset  . Cancer Mother    History  Substance Use Topics  . Smoking status: Former Smoker -- 0.50 packs/day for 30 years    Quit date: 09/02/2012  . Smokeless tobacco: Former NeurosurgeonUser  . Alcohol Use: No     Comment: denies use 02/14/14    Review of Systems  Constitutional: Negative for fever and chills.  Genitourinary: Negative for dysuria and difficulty urinating.  Musculoskeletal: Positive for joint swelling and arthralgias. Negative for neck pain and neck stiffness.  Skin: Negative for color change, rash and wound.  Neurological: Negative for dizziness, weakness and numbness.  All other systems reviewed and are  negative.     Allergies  Ketorolac tromethamine  Home Medications   Prior to Admission medications   Medication Sig Start Date End Date Taking? Authorizing Provider  albuterol (PROVENTIL HFA;VENTOLIN HFA) 108 (90 BASE) MCG/ACT inhaler Inhale 2 puffs into the lungs 2 (two) times daily as needed for wheezing or shortness of breath. 07/26/13   Beau FannyJohn C Withrow, FNP  amLODipine (NORVASC) 10 MG tablet Take 1 tablet (10 mg total) by mouth daily. 07/26/13   Beau FannyJohn C Withrow, FNP  budesonide-formoterol (SYMBICORT) 160-4.5 MCG/ACT inhaler Inhale 2 puffs into the lungs 2 (two) times daily.    Historical Provider, MD  cyclobenzaprine (FLEXERIL) 10 MG tablet Take 1 tablet (10 mg total) by mouth 2 (two) times daily as needed for muscle spasms. 01/06/14   Kathie DikeHobson M Bryant, PA-C  gabapentin (NEURONTIN) 300 MG capsule Take 2 capsules (600 mg total) by mouth 3 (three) times daily. For nerve pain 07/26/13   Beau FannyJohn C Withrow, FNP  HYDROcodone-acetaminophen (NORCO) 7.5-325 MG per tablet Take 1 tablet by mouth every 4 (four) hours as needed. 02/14/14   Elson AreasLeslie K Sofia, PA-C  loratadine (CLARITIN) 10 MG tablet Take 10 mg by mouth daily as needed for itching.    Historical Provider, MD  oxyCODONE-acetaminophen (PERCOCET/ROXICET) 5-325 MG per tablet Take 1 tablet by mouth every 4 (four) hours as needed. 02/16/14  Kacen Mellinger L. Mes[place] Janus, PA-C  propranolol (INDERAL) 20 MG tablet Take 1 tablet (20 mg total) by mouth daily. 07/26/13   Beau FannyJohn C Withrow, FNP  traZODone (DESYREL) 50 MG tablet Take 25 mg by mouth at bedtime.    Historical Provider, MD   BP 135/74 mmHg  Pulse 70  Temp(Src) 97.5 F (36.4 C) (Oral)  Resp 18  Ht 5\' 6"  (1.676 m)  Wt 158 lb (71.668 kg)  BMI 25.51 kg/m2  SpO2 98% Physical Exam  Constitutional: He is oriented to person, place, and time. He appears well-developed and well-nourished. No distress.  HENT:  Head: Normocephalic and atraumatic.  Cardiovascular: Normal rate, regular rhythm and normal heart sounds.    Pulmonary/Chest: Effort normal and breath sounds normal.  Musculoskeletal: He exhibits tenderness. He exhibits no edema.  ttp of the lateral right wrist.  Mild to moderate STS of the dorsal right hand.  Radial pulse is brisk, distal sensation intact.  CR< 2 sec.  No bruising or bony deformity.   Compartments soft.  Neurological: He is alert and oriented to person, place, and time. He exhibits normal muscle tone. Coordination normal.  Skin: Skin is warm and dry.  Nursing note and vitals reviewed.   ED Course  Procedures (including critical care time) Labs Review Labs Reviewed - No data to display  Imaging Review No results found.   EKG Interpretation None      MDM   Final diagnoses:  Wrist pain, acute, right    Pt seen here on 02/14/14 for same.  Ran out of pain medication.  [facility]  No concerning sx's for compartment syndrome.  Likely ligamentous injury of the wrist.  Pt wearing velcro wrist splint.      Davinci Glotfelty L. Trisha Mangleriplett, PA-C 02/17/14 2115  Victor Quarryanielle S Ray, MD 02/20/14 [phone]

## 2014-09-05 IMAGING — CR DG ANKLE COMPLETE 3+V*L*
3 series · 3 of 3 positions shown · non-contrast
Comparison: 01/04/2012 and prior radiographs

CLINICAL DATA: Open wound over the lateral malleolus with pain and
swelling.  Surgery 3 months ago.

LEFT ANKLE COMPLETE - 3+ VIEW

[view not recorded (1 of 3)]
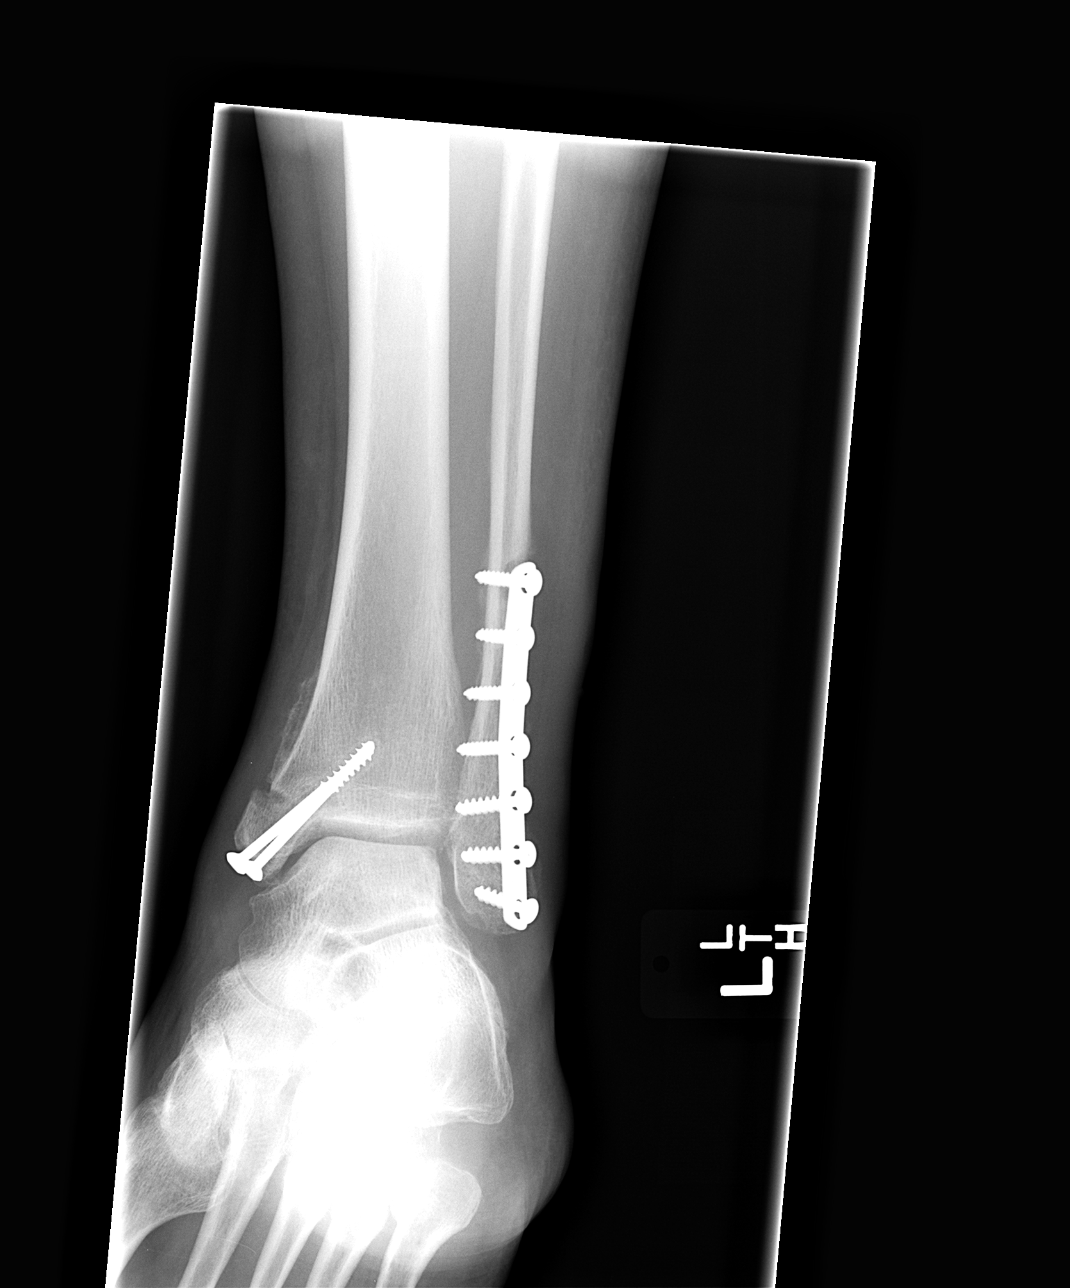

[view not recorded (2 of 3)]
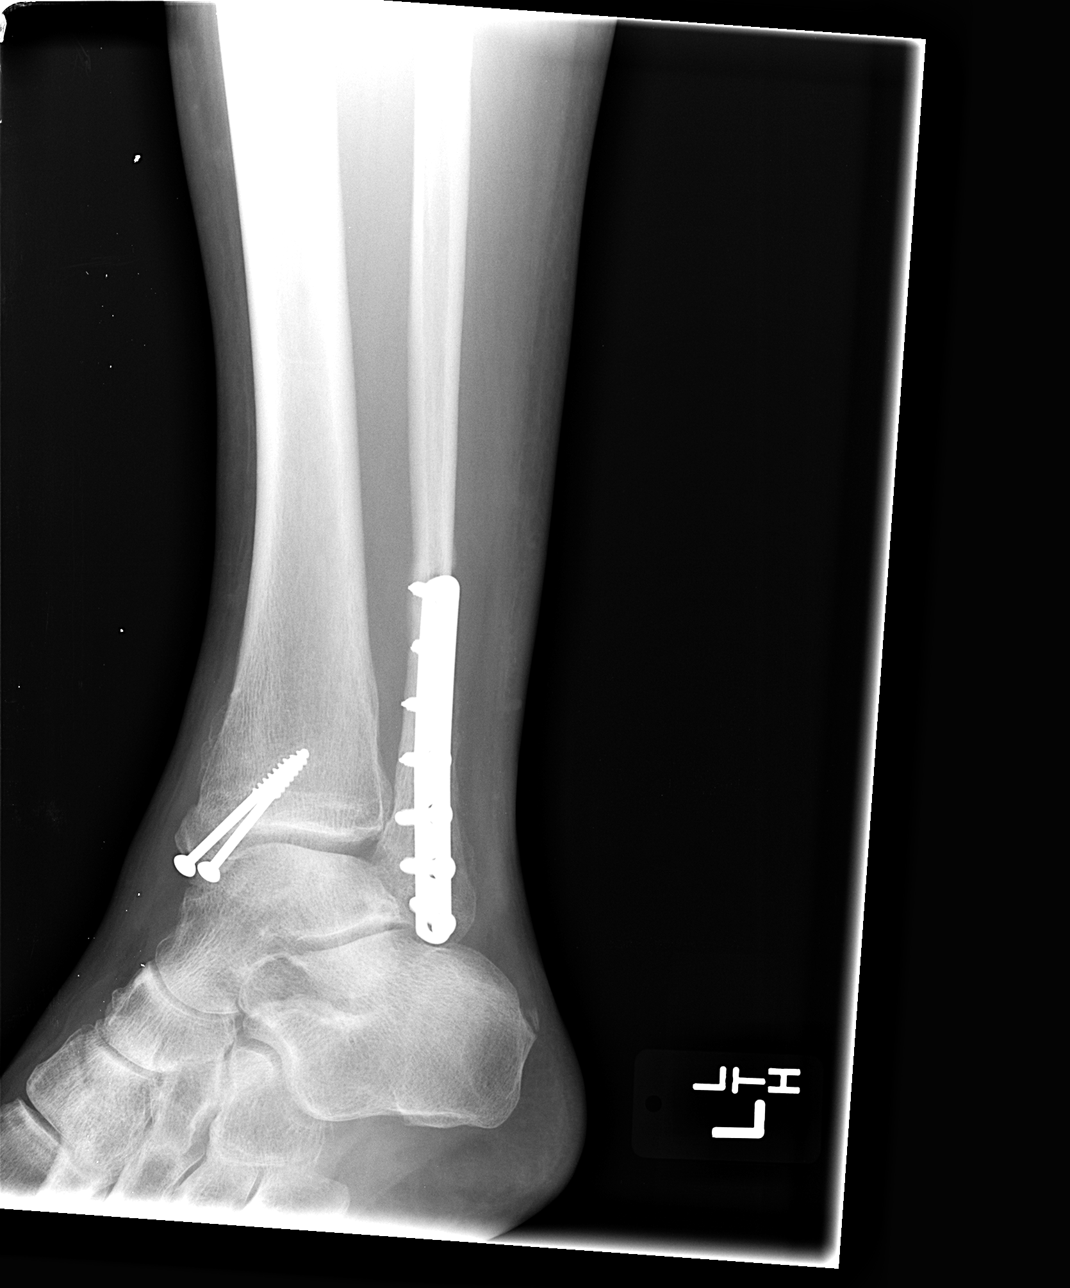

[view not recorded (3 of 3)]
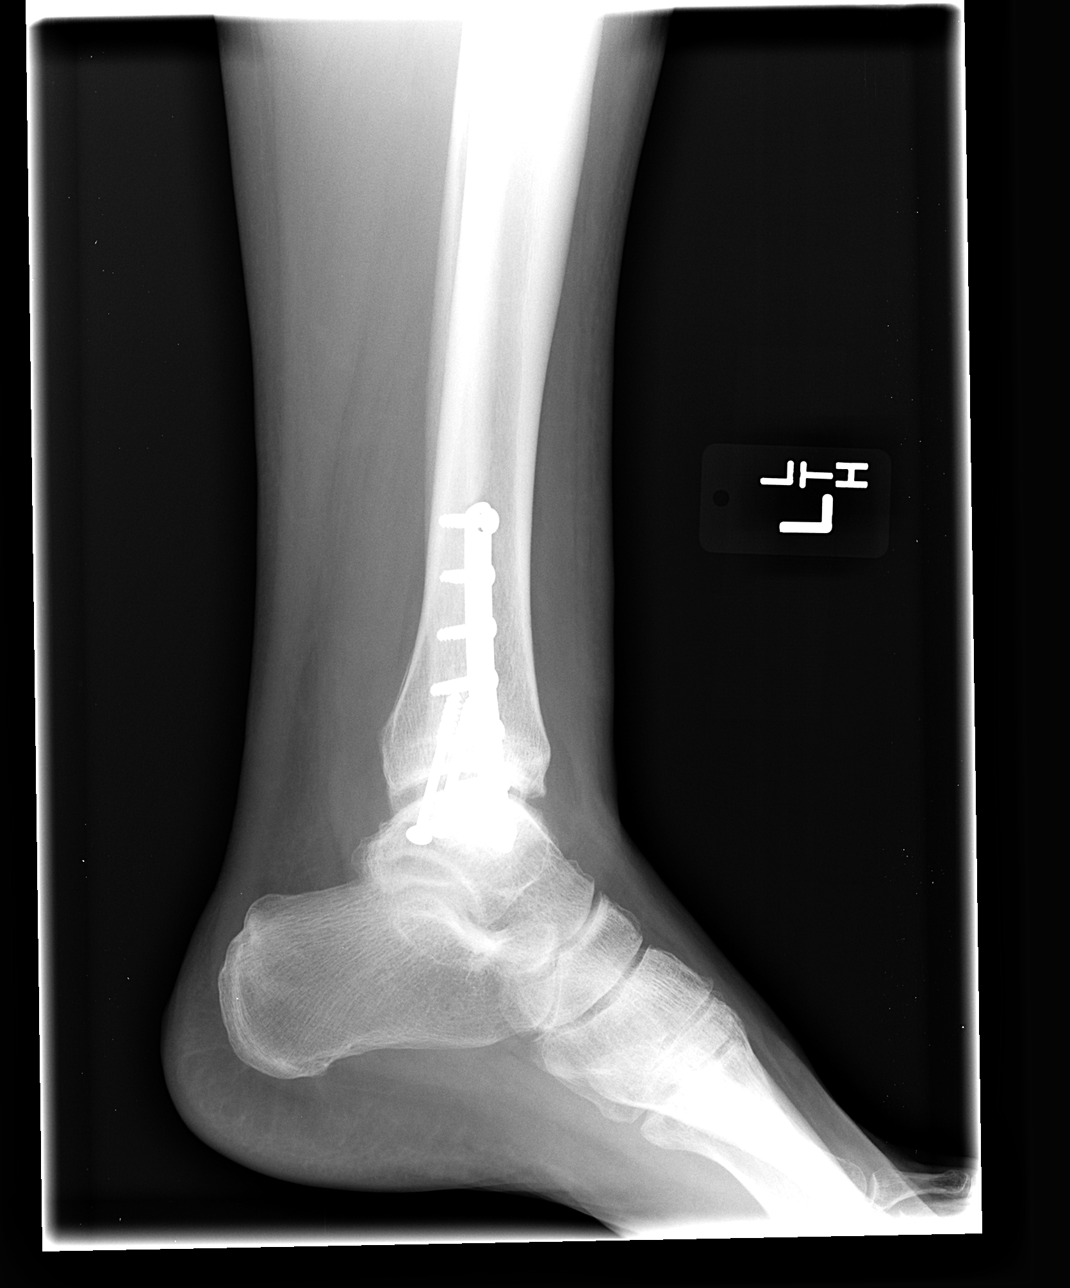

[3 of 3 positions shown; findings below may reference images not displayed]

FINDINGS: Internal plate screw fixation of the distal fibula and
two screws traversing a healing medial malleolar fracture are
noted.
There is lucency along the superior aspect of the fibular plate and
upper most screw - suspicious for infection or possibly loosening.
There is no evidence of acute fracture, subluxation or dislocation.
Tibiotalar joint degenerative changes are present.
IMPRESSION: Lucency along the superior aspect of the fibular plate and upper
most screw worrisome for infection or possibly loosening.

## 2016-11-23 ENCOUNTER — Encounter (HOSPITAL_COMMUNITY): Payer: Self-pay | Admitting: *Deleted

## 2016-11-23 ENCOUNTER — Emergency Department (HOSPITAL_COMMUNITY)
Admission: EM | Admit: 2016-11-23 | Discharge: 2016-11-23 | Disposition: A | Discharge status: Home / Self Care | Payer: Non-veteran care | Attending: Emergency Medicine | Admitting: Emergency Medicine

## 2016-11-23 ENCOUNTER — Emergency Department (HOSPITAL_COMMUNITY): Payer: Non-veteran care

## 2016-11-23 DIAGNOSIS — J189 Pneumonia, unspecified organism: Secondary | ICD-10-CM | POA: Diagnosis not present

## 2016-11-23 DIAGNOSIS — J441 Chronic obstructive pulmonary disease with (acute) exacerbation: Secondary | ICD-10-CM | POA: Diagnosis not present

## 2016-11-23 DIAGNOSIS — R05 Cough: Secondary | ICD-10-CM | POA: Diagnosis present

## 2016-11-23 DIAGNOSIS — F17200 Nicotine dependence, unspecified, uncomplicated: Secondary | ICD-10-CM | POA: Diagnosis not present

## 2016-11-23 DIAGNOSIS — I10 Essential (primary) hypertension: Secondary | ICD-10-CM | POA: Insufficient documentation

## 2016-11-23 DIAGNOSIS — Z79899 Other long term (current) drug therapy: Secondary | ICD-10-CM | POA: Insufficient documentation

## 2016-11-23 DIAGNOSIS — R059 Cough, unspecified: Secondary | ICD-10-CM

## 2016-11-23 DIAGNOSIS — Z96649 Presence of unspecified artificial hip joint: Secondary | ICD-10-CM | POA: Diagnosis not present

## 2016-11-23 HISTORY — DX: Thrombocytopenia, unspecified: D69.6

## 2016-11-23 LAB — CBC WITH DIFFERENTIAL/PLATELET
Basophils Absolute: 0 10*3/uL (ref 0.0–0.1)
Basophils Relative: 0 %
Eosinophils Absolute: 0.1 10*3/uL (ref 0.0–0.7)
Eosinophils Relative: 1 %
HCT: 43.7 % (ref 39.0–52.0)
Hemoglobin: 15.3 g/dL (ref 13.0–17.0)
Lymphocytes Relative: 12 %
Lymphs Abs: 1.6 10*3/uL (ref 0.7–4.0)
MCH: 32.1 pg (ref 26.0–34.0)
MCHC: 35 g/dL (ref 30.0–36.0)
MCV: 91.8 fL (ref 78.0–100.0)
Monocytes Absolute: 0.7 10*3/uL (ref 0.1–1.0)
Monocytes Relative: 5 %
Neutro Abs: 10.5 10*3/uL — ABNORMAL HIGH (ref 1.7–7.7)
Neutrophils Relative %: 82 %
Platelets: 137 10*3/uL — ABNORMAL LOW (ref 150–400)
RBC: 4.76 MIL/uL (ref 4.22–5.81)
RDW: 13.4 % (ref 11.5–15.5)
WBC: 12.8 10*3/uL — ABNORMAL HIGH (ref 4.0–10.5)

## 2016-11-23 LAB — COMPREHENSIVE METABOLIC PANEL
ALT: 19 U/L (ref 17–63)
AST: 29 U/L (ref 15–41)
Albumin: 3.6 g/dL (ref 3.5–5.0)
Alkaline Phosphatase: 128 U/L — ABNORMAL HIGH (ref 38–126)
Anion gap: 10 (ref 5–15)
BUN: 5 mg/dL — ABNORMAL LOW (ref 6–20)
CO2: 26 mmol/L (ref 22–32)
Calcium: 9.3 mg/dL (ref 8.9–10.3)
Chloride: 103 mmol/L (ref 101–111)
Creatinine, Ser: 0.72 mg/dL (ref 0.61–1.24)
GFR calc Af Amer: >60 mL/min (ref >60)
GFR calc non Af Amer: >60 mL/min (ref >60)
Glucose, Bld: 128 mg/dL — ABNORMAL HIGH (ref 65–99)
Potassium: 3.6 mmol/L (ref 3.5–5.1)
Sodium: 139 mmol/L (ref 135–145)
Total Bilirubin: 0.7 mg/dL (ref 0.3–1.2)
Total Protein: 7.4 g/dL (ref 6.5–8.1)

## 2016-11-23 LAB — I-STAT TROPONIN, ED: TROPONIN I, POC: 0 ng/mL (ref 0.00–0.08)

## 2016-11-23 LAB — I-STAT CG4 LACTIC ACID, ED: Lactic Acid, Venous: 1.18 mmol/L (ref 0.5–1.9)

## 2016-11-23 MED ORDER — ALBUTEROL (5 MG/ML) CONTINUOUS INHALATION SOLN
10.0000 mg/h | INHALATION_SOLUTION | Freq: Once | RESPIRATORY_TRACT | Status: AC
  Administered 2016-11-23: 10 mg/h via RESPIRATORY_TRACT
  Filled 2016-11-23: qty 20

## 2016-11-23 MED ORDER — IPRATROPIUM-ALBUTEROL 0.5-2.5 (3) MG/3ML IN SOLN
3.0000 mL | Freq: Once | RESPIRATORY_TRACT | Status: AC
  Administered 2016-11-23: 3 mL via RESPIRATORY_TRACT
  Filled 2016-11-23: qty 3

## 2016-11-23 MED ORDER — CEFTRIAXONE SODIUM 1 G IJ SOLR
1.0000 g | Freq: Once | INTRAMUSCULAR | Status: AC
  Administered 2016-11-23: 1 g via INTRAVENOUS
  Filled 2016-11-23: qty 10

## 2016-11-23 MED ORDER — ALBUTEROL SULFATE (2.5 MG/3ML) 0.083% IN NEBU
INHALATION_SOLUTION | RESPIRATORY_TRACT | Status: AC
  Administered 2016-11-23: 2.5 mg
  Filled 2016-11-23: qty 3

## 2016-11-23 MED ORDER — DEXTROSE 5 % IV SOLN
500.0000 mg | Freq: Once | INTRAVENOUS | Status: AC
  Administered 2016-11-23: 500 mg via INTRAVENOUS
  Filled 2016-11-23: qty 500

## 2016-11-23 MED ORDER — METHYLPREDNISOLONE SODIUM SUCC 125 MG IJ SOLR
125.0000 mg | Freq: Once | INTRAMUSCULAR | Status: AC
  Administered 2016-11-23: 125 mg via INTRAVENOUS
  Filled 2016-11-23: qty 2

## 2016-11-23 MED ORDER — IPRATROPIUM-ALBUTEROL 0.5-2.5 (3) MG/3ML IN SOLN: 3.0000 mL | Freq: Once | RESPIRATORY_TRACT | Status: DC

## 2016-11-23 MED ORDER — ALBUTEROL SULFATE (2.5 MG/3ML) 0.083% IN NEBU
2.5000 mg | INHALATION_SOLUTION | Freq: Once | RESPIRATORY_TRACT | Status: DC
Start: 2016-11-23 — End: 2016-11-23

## 2016-11-23 MED ORDER — IPRATROPIUM BROMIDE 0.02 % IN SOLN
1.0000 mg | Freq: Once | RESPIRATORY_TRACT | Status: AC
  Administered 2016-11-23: 1 mg via RESPIRATORY_TRACT
  Filled 2016-11-23: qty 5

## 2016-11-23 NOTE — ED Notes (Signed)
Pt states he is leaving AMA.  Explained the risks of leaving AMA including worsening of symptoms and possible death.  [place] in [place] and if he does not leave his ride will leave him.  The woman with him is very insistent they leave now as she needs to get home to her animals.  Pt verbalized understanding of risks and states [facility].

## 2016-11-23 NOTE — ED Notes (Signed)
Patient ambulated around nurse's station, tolerated well. O2 sat 92% after ambulating. Per patient "feels better." EDp made aware.

## 2016-11-23 NOTE — ED Triage Notes (Addendum)
Pt c/o cough that is productive with green sputum productive with green sputum and sob that has become worse tonight,

## 2016-11-23 NOTE — ED Provider Notes (Signed)
[facility]-EM[facility]Y DEPT Provider Note   CSN: [id number] Arrival date & time: 11/23/16  0435     History   Chief Complaint Chief Complaint  Patient presents with  . Cough    HPI Victor Davidson is a 66 y.o. male.  HPI  Pt was seen at 0710.  Per pt, c/o gradual onset and persistence of constant cough and SOB for the past 1 week, worse since yesterday. Describes his cough as productive of "green" sputum. Pt states he "thinks I might have pneumonia." Denies fevers, no back pain, no CP/palpitations, no abd pain, no N/V/D.    Past Medical History:  Diagnosis Date  . Chronic pain   . Chronic pain of left knee   . Cirrhosis (HCC)   . COPD (chronic obstructive pulmonary disease) (HCC)   . Hepatitis C   . Herniated disc   . Hypertension   . PTSD (post-traumatic stress disorder)   . Thrombocytopenia [facility](HCC)     Patient Active Problem List   Diagnosis Date Noted  . Major depression 07/25/2013  . Leukocytosis, unspecified 10/05/2012  . COPD with acute exacerbation (HCC) 10/03/2012  . Syncope 10/03/2012  . Hepatitis C 10/03/2012  . Community acquired pneumonia 10/03/2012  . Liver cirrhosis (HCC) 10/03/2012  . Thrombocytopenia, secondary 10/03/2012  . Herniated disc     Past Surgical History:  Procedure Laterality Date  . ANKLE SURGERY    . JOINT REPLACEMENT    . KNEE SURGERY    . TONSILLECTOMY    . TOTAL HIP ARTHROPLASTY         Home Medications    Prior to Admission medications   Medication Sig Start Date End Date Taking? Authorizing Provider  albuterol (PROVENTIL HFA;VENTOLIN HFA) 108 (90 BASE) MCG/ACT inhaler Inhale 2 puffs into the lungs 2 (two) times daily as needed for wheezing or shortness of breath. 07/26/13  Yes Withrow, Everardo AllJohn C, FNP  amLODipine (NORVASC) 10 MG tablet Take 1 tablet (10 mg total) by mouth daily. 07/26/13  Yes Withrow, Everardo AllJohn C, FNP  budesonide-formoterol (SYMBICORT) 160-4.5 MCG/ACT inhaler Inhale 2 puffs into the lungs 2 (two) times daily.   Yes  [provider]  gabapentin (NEURONTIN) 300 MG capsule Take 2 capsules (600 mg total) by mouth 3 (three) times daily. For nerve pain 07/26/13  Yes Withrow, Everardo AllJohn C, FNP  propranolol (INDERAL) 20 MG tablet Take 1 tablet (20 mg total) by mouth daily. 07/26/13  Yes Withrow, Everardo AllJohn C, FNP  traZODone (DESYREL) 50 MG tablet Take 25 mg by mouth at bedtime.   Yes [provider]  cyclobenzaprine (FLEXERIL) 10 MG tablet Take 1 tablet (10 mg total) by mouth 2 (two) times daily as needed for muscle spasms. 01/06/14   Ivery QualeBryant, Hobson, PA-C  HYDROcodone-acetaminophen (NORCO) 7.5-325 MG per tablet Take 1 tablet by mouth every 4 (four) hours as needed. 02/14/14   Elson AreasSofia, Leslie K, PA-C  loratadine (CLARITIN) 10 MG tablet Take 10 mg by mouth daily as needed for itching.    [provider]  oxyCODONE-acetaminophen (PERCOCET/ROXICET) 5-325 MG per tablet Take 1 tablet by mouth every 4 (four) hours as needed. 02/16/14   Pauline Ausriplett, Tammy, PA-C    Family History Family History  Problem Relation Age of Onset  . Cancer Mother     Social History Social History  Substance Use Topics  . Smoking status: Current Some Day Smoker    Packs/day: 0.50    Years: 30.00    Last attempt to quit: 09/02/2012  . Smokeless tobacco: Former  User  . Alcohol use No     Comment: denies use 02/14/14     Allergies   Ketorolac tromethamine   Review of Systems Review of Systems ROS: Statement: All systems negative except as marked or noted in the HPI; Constitutional: Negative for fever and chills. ; ; Eyes: Negative for eye pain, redness and discharge. ; ; ENMT: Negative for ear pain, hoarseness, nasal congestion, sinus pressure and sore throat. ; ; Cardiovascular: Negative for chest pain, palpitations, diaphoresis, and peripheral edema. ; ; Respiratory: +cough, SOB. Negative for wheezing and stridor. ; ; Gastrointestinal: Negative for nausea, vomiting, diarrhea, abdominal pain, blood in stool, hematemesis, jaundice  and rectal bleeding. . ; ; Genitourinary: Negative for dysuria, flank pain and hematuria. ; ; Musculoskeletal: Negative for back pain and neck pain. Negative for swelling and trauma.; ; Skin: Negative for pruritus, rash, abrasions, blisters, bruising and skin lesion.; ; Neuro: Negative for headache, lightheadedness and neck stiffness. Negative for weakness, altered level of consciousness, altered mental status, extremity weakness, paresthesias, involuntary movement, seizure and syncope.       Physical Exam Updated Vital Signs BP (!) 153/71 (BP Location: Left Arm)   Pulse 73   Temp 99.9 F (37.7 C) (Oral)   Resp 17   Ht 5\' 6"  (1.676 m)   Wt 78.5 kg (173 lb)   SpO2 97%   BMI 27.92 kg/m   Physical Exam 0715: Physical examination:  Nursing notes reviewed; Vital signs and O2 SAT reviewed;  Constitutional: Well developed, Well nourished, Well hydrated, In no acute distress; Head:  Normocephalic, atraumatic; Eyes: EOMI, PERRL, No scleral icterus; ENMT: Mouth and pharynx normal, Mucous membranes moist; Neck: Supple, Full range of motion, No lymphadenopathy; Cardiovascular: Regular rate and rhythm, No gallop; Respiratory: Breath sounds coarse & equal bilaterally, faint wheezes. No audible wheezing. +moist cough during exam. Speaking full sentences with ease, Normal respiratory effort/excursion; Chest: Nontender, Movement normal; Abdomen: Soft, Nontender, Nondistended, Normal bowel sounds; Genitourinary: No CVA tenderness; Extremities: Pulses normal, No tenderness, No edema, No calf edema or asymmetry.; Neuro: AA&Ox3, Major CN grossly intact.  Speech clear. No gross focal motor or sensory deficits in extremities.; Skin: Color normal, Warm, Dry.   ED Treatments / Results  Labs (all labs ordered are listed, but only abnormal results are displayed)   EKG  EKG Interpretation  Date/Time:  Sunday November 23 2016 04:44:15 EDT Ventricular Rate:  61 PR Interval:    QRS Duration: 82 QT  Interval:  400 QTC Calculation: 403 R Axis:   47 Text Interpretation:  Sinus rhythm artifact Normal ECG Since last tracing rate slower 24 Jul 2013 Confirmed by Devoria Albe ([id number]) on 11/23/2016 5:21:17 AM       Radiology   Procedures Procedures (including critical care time)  Medications Ordered in ED Medications  azithromycin (ZITHROMAX) 500 mg in dextrose 5 % 250 mL IVPB (500 mg Intravenous New Bag/Given 11/23/16 0725)  albuterol (PROVENTIL,VENTOLIN) solution continuous neb (not administered)  ipratropium (ATROVENT) nebulizer solution 1 mg (not administered)  methylPREDNISolone sodium succinate (SOLU-MEDROL) 125 mg/2 mL injection 125 mg (not administered)  ipratropium-albuterol (DUONEB) 0.5-2.5 (3) MG/3ML nebulizer solution 3 mL (3 mLs Nebulization Given 11/23/16 0529)  albuterol (PROVENTIL) (2.5 MG/3ML) 0.083% nebulizer solution (2.5 mg  Given 11/23/16 0529)  cefTRIAXone (ROCEPHIN) 1 g in dextrose 5 % 50 mL IVPB (0 g Intravenous Stopped 11/23/16 0719)     Initial Impression / Assessment and Plan / ED Course  I have reviewed the triage vital signs and the nursing  notes.  Pertinent labs & imaging results that were available during my care of the patient were reviewed by me and considered in my medical decision making (see chart for details).  MDM Reviewed: previous chart, nursing note and vitals Reviewed previous: labs and ECG Interpretation: labs, ECG and x-ray    Results for orders placed or performed during the hospital encounter of 11/23/16  Culture, blood (routine x 2)  Result Value Ref Range   Specimen Description BLOOD LEFT ARM    Special Requests      BOTTLES DRAWN AEROBIC AND ANAEROBIC Blood Culture adequate volume   Culture NO GROWTH <12 HOURS    Report Status PENDING   Culture, blood (routine x 2)  Result Value Ref Range   Specimen Description BLOOD RIGHT ARM DRAWN BY RN    Special Requests      BOTTLES DRAWN AEROBIC AND ANAEROBIC Blood Culture adequate volume    Culture NO GROWTH <12 HOURS    Report Status PENDING   Comprehensive metabolic panel  Result Value Ref Range   Sodium 139 135 - 145 mmol/L   Potassium 3.6 3.5 - 5.1 mmol/L   Chloride 103 101 - 111 mmol/L   CO2 26 22 - 32 mmol/L   Glucose, Bld 128 (H) 65 - 99 mg/dL   BUN 5 (L) 6 - 20 mg/dL   Creatinine, Ser 4.09 0.61 - 1.24 mg/dL   Calcium 9.3 8.9 - 81.1 mg/dL   Total Protein 7.4 6.5 - 8.1 g/dL   Albumin 3.6 3.5 - 5.0 g/dL   AST 29 15 - 41 U/L   ALT 19 17 - 63 U/L   Alkaline Phosphatase 128 (H) 38 - 126 U/L   Total Bilirubin 0.7 0.3 - 1.2 mg/dL   GFR calc non Af Amer >60 >60 mL/min   GFR calc Af Amer >60 >60 mL/min   Anion gap 10 5 - 15  CBC with Differential  Result Value Ref Range   WBC 12.8 (H) 4.0 - 10.5 K/uL   RBC 4.76 4.22 - 5.81 MIL/uL   Hemoglobin 15.3 13.0 - 17.0 g/dL   HCT 91.4 78.2 - 95.6 %   MCV 91.8 78.0 - 100.0 fL   MCH 32.1 26.0 - 34.0 pg   MCHC 35.0 30.0 - 36.0 g/dL   RDW 21.3 08.6 - 57.8 %   Platelets 137 (L) 150 - 400 K/uL   Neutrophils Relative % 82 %   Neutro Abs 10.5 (H) 1.7 - 7.7 K/uL   Lymphocytes Relative 12 %   Lymphs Abs 1.6 0.7 - 4.0 K/uL   Monocytes Relative 5 %   Monocytes Absolute 0.7 0.1 - 1.0 K/uL   Eosinophils Relative 1 %   Eosinophils Absolute 0.1 0.0 - 0.7 K/uL   Basophils Relative 0 %   Basophils Absolute 0.0 0.0 - 0.1 K/uL  I-stat troponin, ED  Result Value Ref Range   Troponin i, poc 0.00 0.00 - 0.08 ng/mL   Comment 3          I-Stat CG4 Lactic Acid, ED  Result Value Ref Range   Lactic Acid, Venous 1.18 0.5 - 1.9 mmol/L   Dg Chest 2 View Result Date: 11/23/2016 CLINICAL DATA:  66 y/o M; cough and shortness of breath. History of COPD. EXAM: CHEST  2 VIEW COMPARISON:  07/07/2013 chest radiograph. FINDINGS: Stable normal cardiac silhouette given projection and technique. Stable linear opacity in the right costal diaphragmatic angle likely representing scarring. Bronchitic changes and right infrahilar opacity in  the right lower  lobe. No pleural effusion or pneumothorax. Multilevel degenerative changes of the spine. IMPRESSION: Bronchitic changes and right infrahilar opacity which may represent associated atelectasis or infiltrate. Electronically Signed   By: Mitzi Hansen M.D.   On: 11/23/2016 05:45    1000:  Pt given IV solumedrol, hour long neb and abx for possible CAP. Pt ambulated after neb with steady gait, easy resps, Sats 92% R/A. Pt states he "felt better" and "was leaving right now." Pt then left the ED at the insistence of "his ride" before workup/disposition complete.    Final Clinical Impressions(s) / ED Diagnoses   Final diagnoses:  None    New Prescriptions New Prescriptions   No medications on file     Samuel Jester, DO 11/26/16 1657

## 2016-11-28 LAB — CULTURE, BLOOD (ROUTINE X 2)
CULTURE: NO GROWTH
Culture: NO GROWTH
Special Requests: ADEQUATE
Special Requests: ADEQUATE
